# Patient Record
Sex: Female | Born: 1970 | Race: White | Hispanic: No | Marital: Married
Health system: Midwestern US, Community
[De-identification: ages and names within clinical notes are randomized; demographics above are authoritative.]

---

## 2006-09-25 ENCOUNTER — Inpatient Hospital Stay

## 2008-09-08 LAB — COMPREHENSIVE METABOLIC PANEL
ALT: 19 U/L (ref 10–40)
AST: 19 U/L (ref 15–37)
Albumin/Globulin Ratio: 1.6 (ref 1.1–2.2)
Albumin: 4 g/dL (ref 3.4–5.0)
Alkaline Phosphatase: 90 U/L (ref 25–100)
BUN: 24 mg/dL — ABNORMAL HIGH (ref 7–18)
CO2: 23 meq/L (ref 21–32)
Calcium: 9 mg/dL (ref 8.3–10.6)
Chloride: 107 meq/L (ref 99–110)
Creatinine: 0.7 mg/dL (ref 0.6–1.1)
GFR Est, African/Amer: >60
GFR, Estimated: >60 (ref >60)
Glucose: 110 mg/dL — ABNORMAL HIGH (ref 70–99)
Potassium: 4.5 meq/L (ref 3.5–5.1)
Sodium: 140 meq/L (ref 136–145)
Total Bilirubin: 0.5 mg/dL (ref 0.0–1.0)
Total Protein: 6.4 g/dL (ref 6.4–8.2)

## 2008-09-08 LAB — LIPID PANEL
Cholesterol, Total: 176 mg/dl (ref <200)
HDL: 40 mg/dL (ref 40–60)
LDL Calculated: 120 mg/dl — ABNORMAL HIGH (ref <100)
Triglycerides: 84 mg/dl (ref <150)
VLDL Cholesterol Calculated: 17 mg/dl

## 2008-09-15 NOTE — Unmapped (Signed)
Signed by Deeann Dowse MD on 09/15/2008 at 00:00:00  CT Abdomen/Pelvis      Imported By: Betsey Amen 06/22/2009 13:20:08    _____________________________________________________________________    External Attachment:    Please see [facility] EMR for this document.

## 2008-09-16 LAB — CBC WITH AUTO DIFFERENTIAL
Basophils %: 0.5 % (ref 0.0–2.0)
Basophils Absolute: 0 10*3 (ref 0.0–0.2)
Eosinophils %: 2.1 % (ref 0.0–5.0)
Eosinophils Absolute: 0.1 10*3 (ref 0.0–0.6)
Granulocyte Absolute Count: 3.8 10*3 (ref 1.7–7.7)
Hematocrit: 39.5 % (ref 36.0–48.0)
Hemoglobin: 13.3 g/dL (ref 12.0–16.0)
Lymphocytes %: 32.3 % (ref 25.0–40.0)
Lymphocytes Absolute: 2.1 10*3 (ref 1.0–5.1)
MCH: 29.2 pg (ref 26–34)
MCHC: 33.7 g/dL (ref 31–36)
MCV: 86.7 fl (ref 80–100)
MPV: 10.7 fl — ABNORMAL HIGH (ref 5.0–10.5)
Monocytes %: 7.3 % (ref 0.0–8.0)
Monocytes Absolute: 0.5 10*3 (ref 0.0–0.95)
Platelets: 180 10*3 (ref 135–450)
RBC: 4.56 10*6 (ref 4.0–5.2)
RDW: 15.1 % — ABNORMAL HIGH (ref 11.5–14.5)
Segs Relative: 57.8 % (ref 42.0–63.0)
WBC: 6.5 10*3 (ref 4.0–11.0)

## 2008-09-16 LAB — TSH
T4 Free: 0.78 ng/dL — ABNORMAL LOW (ref 0.9–1.8)
TSH: 2.75 u[IU]/mL (ref 0.35–5.5)

## 2008-09-16 LAB — T4, FREE: T4 Free: 0.78 ng/dL — ABNORMAL LOW (ref 0.9–1.8)

## 2008-09-17 LAB — VITAMIN D 25 HYDROXY: Vit D, 25-Hydroxy: 42 ng/mL (ref 30–80)

## 2008-12-25 LAB — LIPID PANEL
Cholesterol, Total: 174 mg/dl (ref <200)
HDL: 41 mg/dL (ref 40–60)
LDL Calculated: 113 mg/dl — ABNORMAL HIGH (ref <100)
Triglycerides: 100 mg/dl (ref <150)
VLDL Cholesterol Calculated: 20 mg/dl

## 2008-12-25 LAB — COMPREHENSIVE METABOLIC PANEL
ALT: 20 U/L (ref 10–40)
AST: 18 U/L (ref 15–37)
Albumin/Globulin Ratio: 1.6 (ref 1.1–2.2)
Albumin: 4.1 g/dL (ref 3.4–5.0)
Alkaline Phosphatase: 116 U/L — ABNORMAL HIGH (ref 25–100)
BUN: 26 mg/dL — ABNORMAL HIGH (ref 7–18)
CO2: 28 meq/L (ref 21–32)
Calcium: 9.5 mg/dL (ref 8.3–10.6)
Chloride: 106 meq/L (ref 99–110)
Creatinine: 1 mg/dL (ref 0.6–1.1)
GFR Est, African/Amer: >60
GFR, Estimated: >60 (ref >60)
Glucose: 103 mg/dL — ABNORMAL HIGH (ref 70–99)
Potassium: 4.1 meq/L (ref 3.5–5.1)
Sodium: 147 meq/L — ABNORMAL HIGH (ref 136–145)
Total Bilirubin: 0.4 mg/dL (ref 0.0–1.0)
Total Protein: 6.6 g/dL (ref 6.4–8.2)

## 2008-12-25 LAB — T4, FREE: T4 Free: 1.04 ng/dL (ref 0.9–1.8)

## 2008-12-25 LAB — TSH
T4 Free: 1.04 ng/dL (ref 0.9–1.8)
TSH: 3.37 u[IU]/mL (ref 0.35–5.5)

## 2008-12-29 LAB — VITAMIN D 25 HYDROXY: Vit D, 25-Hydroxy: 41 ng/mL (ref 30–80)

## 2009-01-07 LAB — COMPREHENSIVE METABOLIC PANEL
ALT: 20 U/L (ref 10–40)
AST: 19 U/L (ref 15–37)
Albumin/Globulin Ratio: 1.6 (ref 1.1–2.2)
Albumin: 4.3 g/dL (ref 3.4–5.0)
Alkaline Phosphatase: 121 U/L
BUN: 21 mg/dL — ABNORMAL HIGH (ref 7–18)
CO2: 26 meq/L (ref 21–32)
Calcium: 9.6 mg/dL (ref 8.3–10.6)
Chloride: 107 meq/L (ref 99–110)
Creatinine: 0.8 mg/dL (ref 0.6–1.1)
GFR Est, African/Amer: >60
GFR, Estimated: >60 (ref >60)
Glucose: 116 mg/dL — ABNORMAL HIGH (ref 70–99)
Potassium: 4.4 meq/L (ref 3.5–5.1)
Sodium: 141 meq/L (ref 136–145)
Total Bilirubin: 0.5 mg/dL (ref 0.0–1.0)
Total Protein: 7 g/dL (ref 6.4–8.2)

## 2009-05-02 LAB — CBC WITH AUTO DIFFERENTIAL
Basophils %: 0.4 % (ref 0.0–2.0)
Basophils Absolute: 0 10*3 (ref 0.0–0.2)
Eosinophils %: 2.1 % (ref 0.0–5.0)
Eosinophils Absolute: 0.1 10*3 (ref 0.0–0.6)
Granulocyte Absolute Count: 4.3 10*3 (ref 1.7–7.7)
Hematocrit: 41.6 % (ref 36.0–48.0)
Hemoglobin: 14 g/dL (ref 12.0–16.0)
Lymphocytes %: 18.5 % — ABNORMAL LOW (ref 25.0–40.0)
Lymphocytes Absolute: 1.1 10*3 (ref 1.0–5.1)
MCH: 28.9 pg (ref 26–34)
MCHC: 33.7 g/dL (ref 31–36)
MCV: 85.8 fl (ref 80–100)
MPV: 9.9 fl (ref 5.0–10.5)
Monocytes %: 7.7 %
Monocytes Absolute: 0.5 10*3
Platelets: 194 10*3 (ref 135–450)
RBC: 4.85 10*6 (ref 4.0–5.2)
RDW: 15.4 % — ABNORMAL HIGH (ref 11.5–14.5)
Segs Relative: 71.3 % — ABNORMAL HIGH (ref 42.0–63.0)
WBC: 6 10*3 (ref 4.0–11.0)

## 2009-05-02 LAB — COMPREHENSIVE METABOLIC PANEL
ALT: 25 U/L (ref 10–40)
AST: 25 U/L (ref 15–37)
Albumin/Globulin Ratio: 1.9 (ref 1.1–2.2)
Albumin: 4.5 g/dL (ref 3.4–5.0)
Alkaline Phosphatase: 117 U/L
BUN: 21 mg/dL — ABNORMAL HIGH (ref 7–18)
CO2: 26 meq/L (ref 21–32)
Calcium: 9.2 mg/dL (ref 8.3–10.6)
Chloride: 107 meq/L (ref 99–110)
Creatinine: 0.8 mg/dL (ref 0.6–1.1)
GFR Est, African/Amer: >60
GFR, Estimated: >60 (ref >60)
Glucose: 120 mg/dL — ABNORMAL HIGH (ref 70–99)
Potassium: 4.2 meq/L (ref 3.5–5.1)
Sodium: 142 meq/L (ref 136–145)
Total Bilirubin: 0.7 mg/dL (ref 0.0–1.0)
Total Protein: 6.9 g/dL (ref 6.4–8.2)

## 2009-05-02 LAB — LIPID PANEL
Cholesterol, Total: 168 mg/dl (ref <200)
HDL: 36 mg/dL — ABNORMAL LOW (ref 40–60)
LDL Calculated: 113 mg/dl — ABNORMAL HIGH (ref <100)
Triglycerides: 97 mg/dl (ref <150)
VLDL Cholesterol Calculated: 19 mg/dl

## 2009-05-02 LAB — TSH
T4 Free: 1.05 ng/dL (ref 0.9–1.8)
TSH: 2.28 u[IU]/mL (ref 0.35–5.5)

## 2009-05-02 LAB — T4, FREE: T4 Free: 1.05 ng/dL (ref 0.9–1.8)

## 2009-05-04 LAB — VITAMIN D 25 HYDROXY: Vit D, 25-Hydroxy: 46 ng/mL (ref 30–80)

## 2009-05-14 ENCOUNTER — Inpatient Hospital Stay: Attending: Sports Medicine

## 2009-06-17 NOTE — Unmapped (Signed)
Signed by Huntley Estelle on 06/17/2009 at 11:56:23    Clinical Lists Changes    Orders:  Added new Test order of CT, Abdomen and Pelvis ([id number]) - Signed

## 2009-06-17 NOTE — Unmapped (Signed)
Signed by Huntley Estelle on 06/17/2009 at 11:39:55    Clinical Lists Changes    Problems:  Added new problem of INCISIONAL HERNIA (ICD-553.21)  Added new problem of ABDOMINAL PAIN (ICD-789.00)  Orders:  Added new Test order of CT, Abdomen and Pelvis w/contrast (CPT-74160/72193) - Signed

## 2009-06-17 NOTE — Unmapped (Signed)
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   Eyes: Denies blurring, diplopia, irritation, discharge, vision loss, eye pain, photophobia, redness.   Ears/Nose/Throat: Denies decreased hearing, dysphagia, ear discharge, earache, facial pain, headaches, hoarseness, mouth ulcers/sores, nasal congestion, nasal ulcers/sores, nosebleeds, post nasal drip, rhinorrhea, sinus pain, sinusitis, sneezing, sore throat, tinnitus.   Cardiovascular: Denies chest pains, dizziness, dyspnea on exertion, dyspnea at rest, palpitations, peripheral edema, PND,  presyncope, orthopnea, syncope.   Respiratory: Denies dyspnea, tachypnea, excessive sputum, hemoptysis, wheezing, productive cough, dry cough, choking, clubbing, sputum, stridor, exercise limitation, chest wall deformity, frequent throat clearing, chest pain, cyanosis, nocturnal cough, exercise-induced cough.   Gastrointestinal: Complains of nausea. Denies vomiting, diarrhea, constipation, change in bowel habits, abdominal pain, melena, hematochezia, jaundice, spitting, encopresis, hematemesis, abdominal distention, edema, ascites.   Genitourinary: Denies vaginal discharge, incontinence, day time wetting, dysuria, hematuria, urinary frequency, amenorrhea, menorrhagia, abnormal vaginal bleeding, pelvic pain, nocturia, flank pain, weak stream, precipitance, incomplete empty, retention, enuresis,  perineal rash, tea-colored urine.   Musculoskeletal: Denies arthritis, extreme deformity, back pain, neck pain, joint pain, joint swelling, joint redness, muscle cramps, muscle weakness, muscle pains, stiffness, warmth, limp.   Skin: Denies hair loss, rash, skin color change, birthmarks, growths, dry skin, moles, warts, bites, pruritus, acne, nail disease, petechia, purpura, suspicious lesions.   Neurologic: Denies weakness.   Psychiatric: Denies behavioral changes, depression, anxiety, memory loss, mental disturbance, suicidal ideation, homicidal ideation, hallucinations, paranoia, learning disability, hyperactivity.   Endocrine: Denies cold intolerance, heat intolerance, polydipsia, polyphagia, polyuria, weight change, growth problems.   Heme/Lymphatic: Denies bone pain, abnormal bruising,  bleeding, enlarged lymph nodes, node tenderness, pallor.   Allergic/Immunologic: Denies hay fever, HIV exposure, itching eyes, nasal congestion, persistent infections, seasonal allergy symptoms, urticaria.       Vital Signs   Height: 69 in.    Weight: 294 lbs.   Weight Change since last visit (lbs): 294Temperature: 98.2 degrees  F     Allergies  ! * PENICILLIN DRUGS  ! CODEINE PHOSPHATE  ! * SULFA  ! * QUINALONES  Allergy and adverse reaction list reviewed during this update.      Medications   LEVOTHROID 75 MCG TABS (LEVOTHYROXINE SODIUM)   JANUMET  TABS (SITAGLIPTIN-METFORMIN HCL TABS)   PROZAC 10 MG CAPS (FLUOXETINE HCL)   NAPROXEN DR 375 MG TBEC (NAPROXEN)   BYETTA 5 MCG PEN  SOLN (EXENATIDE SOLN) once daily    Intake recorded by: Damien Fusi  June 17, 2009 11:14 AM  Smoking Status: non-smoker   Medications reviewed, updated and verified with patient or patient representative.      Physical Exam   General Appearance: well-developed, well-nourished and in no acute distress    Respiratory   Respiratory Effort: breathing comfortably, no increased work of breathing    Cardiovascular   Auscultation: regular rate and  rhythm, S1, S2, no murmur, rub, or  gallop    Gastrointestinal   Abdomen: obese, soft, low transverse incision is well healed.  low midline incisional hernia.     Musculoskeletal   Gait and Station: intact without difficulty    Mental Status   Judgement, insight: awake and alert    Assessment and Plan   38 yo female with recurrent incisional hernia repair.  She had a previous laparascopic repair with mesh.  This is becoming more symptomatic.    will re-evaluate with Ct scan and plan for open repair with mesh.        New Problems:  Dx of ABDOMINAL INCISIONAL HERNIA (ICD-553.21)  Onset: 06/17/2009    Today's Orders   TOBACCO USE ASSESSED [CPT-1000F]  CURRENT TOBACCO NON-USER  [CPT-1036F]  PT ENCOUNTER WAS DOCUMENTED USING [facility] CERTIFIED EMR [CPT-G8447]  LIST CURRENT MEDICATIONS WITH DOSAGES AND VERIFICATION DOCUMENTED [[id number]]  29562 - Ofc Consult, Level III [[id number]]    DISPOSITION:    Return to clinic after testing        Medical Decision-Making   Visit was primarily counseling/coordination. Time spent: 20 minutes

## 2009-06-19 ENCOUNTER — Inpatient Hospital Stay

## 2009-06-19 NOTE — Unmapped (Signed)
Signed by   [employer] on 06/19/2009 at 10:59:39  Patient: Tamara Cox  Note: All result statuses are Final unless otherwise noted.    Tests: (1) CT-PELVIS W/O CONT ([id number])    Order Note: [facility] Accession Number: [id number]    Order Note:                                 [name]     Patient: Tamara Cox, Tamara Cox   DOB:     1970/07/19   MRN:     [id number]   FIN:       [id number]   Accn#:   [id number]                                   Computed Tomography     Exam                       Exam Date/Time       Ordering Physician   CT-PELVIS W/O CONT         06/19/2009 09:22 EST Ramata Strothman L   CT-ABDOMEN W/O CONT        06/19/2009 09:22 EST Silveria Botz L     Reason for Exam   (CT-PELVIS W/O CONT)  incisional hernia; abdominal pain   (CT-ABDOMEN W/O CONT)  incisional hernia; abdominal pain     Report   CT of the abdomen and pelvis without IV contrast dated 06/19/2009     Clinical Indication: Incisional hernia. Abdominal pain.     Comparison: 11/16/2002 formed at the [facility].     Technical factors: Multidetector helical scan is performed from the dome of   the diaphragm to the pubic symphysis after administration of oral contrast   agents. IV contrast is not administered per referring physician's order.   Axial images are displayed in 5 mm collimation with a field-of-view of 36.0   cm. The coronal 5 mm images are generated by computer reformation.     Findings:     Lung bases are clear. Noncontrast images of the liver, spleen, pancreas and   gallbladder are normal. No adrenal mass is seen. Bilateral kidneys and   ureters appear normal. The bladder is unremarkable.     The uterus has been removed in the interval. There is interval appearance   of a defect in the lower anterior abdominal wall at midline location   between the right and left rectus muscle. The defect measures 2.5 x 3.5 cm   in transverse and vertical dimensions.  There is herniation of mesenteric   fat without involvement of bowel through  the defect. The hernia sac   measures 3.4 x 7.4 x 6.0 cm in AP, transverse, and vertical dimension.   There is evidence of previous ventral hernia repair, evidenced by a mesh   that has become detached and displaced anteriorly into the hernia sac,   along with multiple anchors used for attachment of the mesh.     The stomach and small bowel appear normal. The evaluation of the colon is   limited by the absence of luminal contrast opacification. There is no   lymphadenopathy or ascites.     Impression:     CT abdomen:     No acute abnormality.     CT pelvis:     1. Interval performance  of hysterectomy and ventral hernia repair.   2. Recurrent ventral hernia in the lower anterior midline abdominal wall as   described.   ********** VERIFIED REPORT **********     Dictated: 06/19/2009 10:37 am     LEE M.D., Garret Reddish   Signed (Electronic Signature):  LEE M.D., SU-JU                 06/19/09   10:59     Technologist: JSR    Order Note:   EMR Routing to: Laurence Aly - ordering - [id number]    Note: An exclamation mark (!) indicates a result that was not dispersed into   the flowsheet.  Document Creation Date: 06/19/2009 10:59 AM  _______________________________________________________________________    (1) Order result status: Final  Collection or observation date-time: 06/19/2009 09:22:08  Requested date-time: 06/19/2009 08:42:00  Receipt date-time:   Reported date-time: 06/19/2009 10:59:21  Referring Physician: Deeann Dowse  Ordering Physician: Deeann Dowse (HUSTET)  Specimen Source: RAD&Rad Type  Source: QRS  Filler Order Number: [id number]  Lab site: [facility]

## 2009-06-19 NOTE — Unmapped (Signed)
Signed by   [employer] on 06/19/2009 at 10:59:40  Patient: Tamara Cox  Note: All result statuses are Final unless otherwise noted.    Tests: (1) CT-ABDOMEN W/O CONT ([id number])    Order Note: [facility] Accession Number: [id number]    Order Note:                                 [name]     Patient: [name], [name]   DOB:     1970/10/30   MRN:     [id number]   FIN:       [id number]   Accn#:   [id number]                                   Computed Tomography     Exam                       Exam Date/Time       Ordering Physician   CT-PELVIS W/O CONT         06/19/2009 09:22 EST Traxton Kolenda L   CT-ABDOMEN W/O CONT        06/19/2009 09:22 EST Rainee Sweatt L     Reason for Exam   (CT-PELVIS W/O CONT)  incisional hernia; abdominal pain   (CT-ABDOMEN W/O CONT)  incisional hernia; abdominal pain     Report   CT of the abdomen and pelvis without IV contrast dated 06/19/2009     Clinical Indication: Incisional hernia. Abdominal pain.     Comparison: 11/16/2002 formed at the [facility].     Technical factors: Multidetector helical scan is performed from the dome of   the diaphragm to the pubic symphysis after administration of oral contrast   agents. IV contrast is not administered per referring physician's order.   Axial images are displayed in 5 mm collimation with a field-of-view of 36.0   cm. The coronal 5 mm images are generated by computer reformation.     Findings:     Lung bases are clear. Noncontrast images of the liver, spleen, pancreas and   gallbladder are normal. No adrenal mass is seen. Bilateral kidneys and   ureters appear normal. The bladder is unremarkable.     The uterus has been removed in the interval. There is interval appearance   of a defect in the lower anterior abdominal wall at midline location   between the right and left rectus muscle. The defect measures 2.5 x 3.5 cm   in transverse and vertical dimensions.  There is herniation of mesenteric   fat without involvement of bowel  through the defect. The hernia sac   measures 3.4 x 7.4 x 6.0 cm in AP, transverse, and vertical dimension.   There is evidence of previous ventral hernia repair, evidenced by a mesh   that has become detached and displaced anteriorly into the hernia sac,   along with multiple anchors used for attachment of the mesh.     The stomach and small bowel appear normal. The evaluation of the colon is   limited by the absence of luminal contrast opacification. There is no   lymphadenopathy or ascites.     Impression:     CT abdomen:     No acute abnormality.     CT pelvis:     1. Interval performance  of hysterectomy and ventral hernia repair.   2. Recurrent ventral hernia in the lower anterior midline abdominal wall as   described.   ********** VERIFIED REPORT **********     Dictated: 06/19/2009 10:37 am     LEE M.D., Garret Reddish   Signed (Electronic Signature):  LEE M.D., SU-JU                 06/19/09   10:59     Technologist: JSR    Order Note:   EMR Routing to: Laurence Aly - ordering - [id number]    Note: An exclamation mark (!) indicates a result that was not dispersed into   the flowsheet.  Document Creation Date: 06/19/2009 10:59 AM  _______________________________________________________________________    (1) Order result status: Final  Collection or observation date-time: 06/19/2009 09:22:08  Requested date-time: 06/19/2009 08:42:00  Receipt date-time:   Reported date-time: 06/19/2009 10:59:21  Referring Physician: Deeann Dowse  Ordering Physician: Deeann Dowse (HUSTET)  Specimen Source: RAD&Rad Type  Source: QRS  Filler Order Number: [id number]  Lab site: [facility]

## 2009-06-25 NOTE — Unmapped (Signed)
Signed by Huntley Estelle on 06/30/2009 at 08:55:56    UCP Surgery Scheduling Form    Surgery  / Procedure Schedule Sheet   Requested Date: 07/16/2009  Requested Time: 2:30 PM  Length of Surgery: 1.5 HOURS  Form Completed By: Huntley Estelle  Surgeon: Deeann Dowse MD  Facility: [facility]  Type of patient: Private  Will patient go to: PACU  Is patient: Out Pt.  Anesthesia Type: General  Post-Op Epidural: No    Allergies:   ! * PENICILLIN DRUGS  ! CODEINE PHOSPHATE  ! * SULFA  ! * QUINALONES      Latex Sensitive: No  Patient will not need PAT appointment    H&P Appointment at: [facility]   Date: 06/25/2009    Procedure   Procedure: RECURRENT INCISIONAL HERNIA REPAIR WITH MESH  Diagnoses: INCISIONAL HERNIA (ICD-553.21)      Patient Information   Name: Tamara Cox  DOB: Sep 14, 1970  SSN: [id number]  Address: [address]  [place], [place]  [address]  Gender: Female  Home phone: [phone]  IDX #: [id number]  Last Word #: [id number]  Primary Insurance: [employer] Mallow OFFICE 2  Member ID #: [id number]  Secondary Insurance: SELF PAY PRIMARY    Surgery Notification   Scheduled Date: 07/16/2009  Scheduled Time: 2:30 pm  Surgery Scheduled with: Deeann Dowse, MD  Confirmation #: [id number]  Date: 06/30/2009  Surgery Confirmed with: Drinda Butts  Date: 06/30/2009

## 2009-06-25 NOTE — Unmapped (Signed)
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   Cardiovascular: Denies chest pains, dizziness, dyspnea on exertion, dyspnea at rest, palpitations, peripheral edema, PND,  presyncope, orthopnea, syncope.   Respiratory: Denies dyspnea, tachypnea, excessive sputum, hemoptysis, wheezing, productive cough, dry cough, choking, clubbing, sputum, stridor, exercise limitation, chest wall deformity, frequent throat clearing, chest pain, cyanosis, nocturnal cough, exercise-induced cough.       Vital Signs   Height: 69 in.    Weight: 299.8 lbs.   Weight Change since last visit (lbs): 5.80  BMI (in-lb): 44.43   BSA (m2): 2.46  Temperature: 98.4 degrees  F     Allergies  ! * PENICILLIN DRUGS  ! CODEINE PHOSPHATE  ! * SULFA  ! * QUINALONES  Allergy and adverse reaction list reviewed during this update.      Medications   LEVOTHROID 75 MCG TABS (LEVOTHYROXINE SODIUM)   JANUMET  TABS (SITAGLIPTIN-METFORMIN HCL TABS)   PROZAC 10 MG CAPS (FLUOXETINE HCL)   NAPROXEN DR 375 MG TBEC (NAPROXEN)   BYETTA 5 MCG PEN  SOLN (EXENATIDE SOLN) once daily    Intake recorded by: Damien Fusi  June 25, 2009 11:13 AM      Physical Exam   General Appearance: well-developed, well-nourished and in no acute distress    Respiratory   Respiratory Effort: breathing comfortably, no increased work of breathing  Percussion: symmetric, no dullness or hyperresonance  Palpation: symmetric  tactile fremitus  Auscultation: CTA bilaterally with symmetric breath sounds, good air movement, no crackles bronchi or wheezing noted    Cardiovascular   Palpation: no thrill, no displacement of PMI  Auscultation: regular rate and rhythm, S1, S2, no murmur, rub, or gallop  Carotid arteries: pulses 2+, symmetric, no bruits  Abdominal aorta: no enlargement or bruits, no tenderness  Femoral arteries: pulses 2+, symmetric, no bruits  Pedal pulse: DP/PT pulses 2+, symmetric, radial pulse 2+ symmetric, brachial pulse 2+ symmetric  Peripheral Circulation: no cyanosis, clubbing or edema, capillary refill brisk, well perfused, no ulceration    Gastrointestinal   Abdomen: soft, non-tender, non-distended, no masses,  bowel sounds present, no rebound tenderness, no guarding, no organomegaly  Liver and Spleen: no enlargement or nodularity  Hernia: recurrent incisional hernia at pfannensteil incision    Assessment and Plan   38 y/o woman with recurrent lower abdominal wall incisional hernia, will plan open repair with mesh in 2 weeks. Patient was informed of risks, benefits and alternatives for the procedure and patient wishes to proceed.  Consent was discussed and signed. Patient was given ample opportunity to have all questions answered.         Today's Orders   53664 - Ofc Vst, New Level III [CPT-99203]    cc:   [name], MD    Medical Decision-Making     Amount/complexity of data to be reviewed   * review/order radiology tests  Risk of complications: moderate

## 2009-06-26 NOTE — Unmapped (Signed)
Signed by Dionne Ano on 06/26/2009 at 11:20:59                   [facility]         General Surgery         [address]         [place], [place] [address]         p [phone] f [phone]         [email or web address]            June 25, 2009              RE: Tamara Cox  DOB:   [birth year]      Dear Eugenie Birks, MD,        I had the pleasure of seeing your patient, Tamara Cox in the office today for follow-up care. Enclosed is a copy of my office note and recommendations regarding this patient.    Thank you for the opportunity to participate in Tamara Cox's care and please do not hesitate to contact me with any questions.    Sincerely,        Deeann Dowse, MD          CC

## 2009-06-30 NOTE — Unmapped (Signed)
Signed by Huntley Estelle on 06/30/2009 at [facility]        [facility] Physician's Checklist/Order Sheet:   Name: Tamara Cox #: [id number]  Age: 38 Years Old Old  DOB: [birth year]  Gender: Female  Home phone: [phone]  Surgeon/Team: Deeann Dowse, MD  Surgery Date: 07/16/2009  Type of Surgery: RECURRENT INCISIONAL HERNIA REPAIR WITH MESH    Admit Status: SDS  Intended Postoperative Disposition: Discharge home  PCP: [name], MD  Needs Language Interpreter: No    Level of CPC Visit:   A.  Phone Screen (no CPC visit) - H&P done by: Surgeon/Designee    Allergies:   ! * PENICILLIN DRUGS  ! CODEINE PHOSPHATE  ! * SULFA  ! * QUINALONES  Allergy and adverse reaction list reviewed during this update.    Latex Allergy: No    Special Prep:   Phisohex scrub     Height: 69 inches.  Weight: 299.8 pounds.    Alternative Physician Order:   Antibiotic: Vancomycin 1 Gm IV x 1    Physician: Deeann Dowse, MD

## 2009-07-16 ENCOUNTER — Inpatient Hospital Stay

## 2009-07-16 NOTE — Unmapped (Signed)
Signed by   [employer] on 07/16/2009 at 19:56:59  Patient: Tamara Cox  Note: All result statuses are Final unless otherwise noted.    Tests: (1)  (MR)    Order Note:                                         [facility]     PATIENT NAME:   Tamara Cox, Tamara Cox                  MR #:  [id number]  DATE OF BIRTH:  [birth year]                        ACCOUNT #:  [id number]  SURGEON:        Deeann Dowse, M.D.               ROOM #:  SDS  SERVICE:        General Surgery                   NURSING UNIT:  MSDS  PRIMARY:        Knute Neu. Duanne Limerick, M.D.            FCSalena Saner  REFERRING:      Deeann Dowse, M.D.               ADMIT DATE:  07/16/2009  DICTATED BY:    Salome Spotted, M.D.            SURGERY DATE:  07/16/2009                                                    DISCHARGE DATE:                                    OPERATIVE REPORT     *-*-*     PREOPERATIVE DIAGNOSES:     1.  Incisional hernia.     POSTOPERATIVE DIAGNOSIS:     1.  Incisional hernia.     PROCEDURE PERFORMED:     1.  Incisional hernia repair with mesh.     SURGEON:  Deeann Dowse M.D.     ASSISTANT:   Salome Spotted M.D.     ANESTHESIA:  General endotracheal.     ESTIMATED BLOOD LOSS:  Minimal.     TIME OF OPERATION:  One and a half hours.     DRAINS:  None.     SPECIMENS REMOVED:  None.     FINDINGS:  Approximately 8 cm x 8 cm hernia defect through the fascia from  the prior Pfannenstiel incision.  A previously placed mesh was also found  within the hernia sac.  No bowel was contained in the hernia, only  preperitoneal fat.     COMPLICATIONS:  None apparent.     INDICATIONS FOR PROCEDURE:  The patient is a 38 year old woman who previously  underwent a gynecologic surgery through a Pfannenstiel incision.  She is  status post a hernia developing in this area and status post a laparoscopic  attempted repair of this hernia.  She then recurred this hernia, again  in the  fascia underlying her Pfannenstiel incision.  She was referred to Dr. Lynetta Mare  for this  problem.  The risks and benefits of open repair of this hernia were  explained to her and she elected to undergo the operation.     DETAILS OF PROCEDURE:  On the day of surgery, the patient initially presented  to the preoperative area.  While there, consent was reviewed and found to be  appropriate.  She was seen by anesthesia and intravenous access was obtained.  From there, she was transferred to the operating room and placed supine on  the operating table.  Her arms were placed out on arm boards bilaterally.  All bony prominences were appropriately padded.  At this point, general  anesthesia was induced and she was endotracheally intubated without  difficulty.  A Foley catheter was placed under sterile conditions, and left  to gravity for the remainder of the case.  Her lower abdomen was then clipped  and then prepped and draped in sterile fashion.  A timeout procedure was then  performed to identify the patient, the attending surgeon, the procedure being  performed, allergies, antibiotic, and that all appropriate equipment was in  the room.  She received a single dose of intravenous vancomycin.     At this point, her previous Pfannenstiel incision was opened with the use of  a 10 blade scalpel.  Dissection was carried down through the subcutaneous  tissues with use of electrocautery until the inferior fascial edge was  identified by palpation.  Using electrocautery, the anterior surface of the  fascia in this direction was then cleared.  The hernia sac was noted,  adherent to the undersurface of the fascia in this location.  At this point,  further dissection was carried down through the subcutaneous tissue until the  superior edge of the fascia could be identified through the incision.  Likewise, the hernia could be identified inferior to this, adherent to the  undersurface of the fascia in this location as well.  Dissection was  continued with electrocautery, gently clearing the hernia sac from  the  undersurface of the fascia in all directions.  At one point, the hernia sac  was entered and a large amount of omentum was noted to be protruding through  it.  There was no bowel noted.  Additionally, the previously placed mesh was  also found to be adherent to this fat protruding into the hernia sac.  All of  this was reduced back into the abdomen, as it was cleared from the  undersurface of the remaining fascia.  Portions of the hernia sac were  divided during this dissection, to free up the anterior surface of the fascia.     Once this had been performed, the fascia was identified in all directions,  and cleared off on both the anterior and posterior surfaces and  circumferentially.  A Ventralight mesh was then introduced into the field and  cut to measure approximately 10 x 10 cm.  This represented at least 2 cm  overlap in every direction from the measured fascial defect.  This was then  placed in an underlay fashion with the use of interrupted 0 Vicryl sutures.  The fascia was then closed over the mesh with interrupted figure-of-eight 0  Prolene sutures.  Care was taken during this time not to have the needle  enter the peritoneal cavity.  The wound was irrigated with warm saline and  found to be hemostatic.  The subcutaneous  tissue, specifically Scarpa's  fascia, was closed with the use of interrupted 3-0 Vicryl sutures.  The skin  was then closed with staples.     At the end of the case, all sponge, needle, and instrument counts were  correct.  Following the procedure, the patient was extubated and transported  to the recovery area in stable condition.  Dr. Lynetta Mare was scrubbed for the  entirety of the case and directed all surgical decision-making.        *-*-*                                              _______________________________________  RM/kbc                                 _____  D:  07/16/2009 16:11                  Deeann Dowse, M.D.  T:  07/16/2009 19:46                  Dictated by:   Salome Spotted, M.D.  Job #:  [id number]                                           OPERATIVE REPORT                                                               PAGE    1 of   1    Note: An exclamation mark (!) indicates a result that was not dispersed into   the flowsheet.  Document Creation Date: 07/16/2009 7:56 PM  _______________________________________________________________________    (1) Order result status: Final  Collection or observation date-time: 07/16/2009 00:00  Requested date-time:   Receipt date-time:   Reported date-time:   Referring Physician: Deeann Dowse  Ordering Physician:  Reviewed In [facility])  Specimen Source:   Source: DBS  Filler Order Number: [id number]  Lab site:

## 2009-07-16 NOTE — Unmapped (Signed)
[facility]     PATIENT NAME:   Tamara Cox, Tamara Cox                  MR #:  [id number]   DATE OF BIRTH:  [birth year]                        ACCOUNT #:  [id number]   SURGEON:        Deeann Dowse, M.D.               ROOM #:  SDS   SERVICE:        General Surgery                   NURSING UNIT:  MS[facility]   PRIMARY:        Knute Neu. Duanne Limerick, M.D.            FCSalena Saner   REFERRING:      Deeann Dowse, M.D.               ADMIT DATE:  07/16/2009   DICTATED BY:    Salome Spotted, M.D.            SURGERY DATE:  07/16/2009                                                     DISCHARGE DATE:                                    OPERATIVE REPORT     *-*-*     PREOPERATIVE DIAGNOSES:     1.  Incisional hernia.     POSTOPERATIVE DIAGNOSIS:     1.  Incisional hernia.     PROCEDURE PERFORMED:     1.  Incisional hernia repair with mesh.     SURGEON:  Deeann Dowse M.D.     ASSISTANT:   Salome Spotted M.D.     ANESTHESIA:  General endotracheal.     ESTIMATED BLOOD LOSS:  Minimal.     TIME OF OPERATION:  One and a half hours.     DRAINS:  None.     SPECIMENS REMOVED:  None.     FINDINGS:  Approximately 8 cm x 8 cm hernia defect through the fascia from   the prior Pfannenstiel incision.  A previously placed mesh was also found   within the hernia sac.  No bowel was contained in the hernia, only   preperitoneal fat.     COMPLICATIONS:  None apparent.     INDICATIONS FOR PROCEDURE:  The patient is a 38 year old woman who previously   underwent a gynecologic surgery through a Pfannenstiel incision.  She is   status post a hernia developing in this area and status post a laparoscopic   attempted repair of this hernia.  She then recurred this hernia, again in the   fascia underlying her Pfannenstiel incision.  She was referred to Dr. Lynetta Mare   for this problem.  The risks and benefits of open repair of this hernia were   explained to her and she elected to undergo the operation.     DETAILS OF PROCEDURE:  On the day  of surgery, the patient initially  presented   to the preoperative area.  While there, consent was reviewed and found to be   appropriate.  She was seen by anesthesia and intravenous access was obtained.   From there, she was transferred to the operating room and placed supine on   the operating table.  Her arms were placed out on arm boards bilaterally.   All bony prominences were appropriately padded.  At this point, general   anesthesia was induced and she was endotracheally intubated without   difficulty.  A Foley catheter was placed under sterile conditions, and left   to gravity for the remainder of the case.  Her lower abdomen was then clipped   and then prepped and draped in sterile fashion.  A timeout procedure was then   performed to identify the patient, the attending surgeon, the procedure being   performed, allergies, antibiotic, and that all appropriate equipment was in   the room.  She received a single dose of intravenous vancomycin.     At this point, her previous Pfannenstiel incision was opened with the use of   a 10 blade scalpel.  Dissection was carried down through the subcutaneous   tissues with use of electrocautery until the inferior fascial edge was   identified by palpation.  Using electrocautery, the anterior surface of the   fascia in this direction was then cleared.  The hernia sac was noted,   adherent to the undersurface of the fascia in this location.  At this point,   further dissection was carried down through the subcutaneous tissue until the   superior edge of the fascia could be identified through the incision.   Likewise, the hernia could be identified inferior to this, adherent to the   undersurface of the fascia in this location as well.  Dissection was   continued with electrocautery, gently clearing the hernia sac from the   undersurface of the fascia in all directions.  At one point, the hernia sac   was entered and a large amount of omentum was noted to be protruding  through   it.  There was no bowel noted.  Additionally, the previously placed mesh was   also found to be adherent to this fat protruding into the hernia sac.  All of   this was reduced back into the abdomen, as it was cleared from the   undersurface of the remaining fascia.  Portions of the hernia sac were   divided during this dissection, to free up the anterior surface of the   fascia.     Once this had been performed, the fascia was identified in all directions,   and cleared off on both the anterior and posterior surfaces and   circumferentially.  A Ventralight mesh was then introduced into the field and   cut to measure approximately 10 x 10 cm.  This represented at least 2 cm   overlap in every direction from the measured fascial defect.  This was then   placed in an underlay fashion with the use of interrupted 0 Vicryl sutures.   The fascia was then closed over the mesh with interrupted figure-of-eight 0   Prolene sutures.  Care was taken during this time not to have the needle   enter the peritoneal cavity.  The wound was irrigated with warm saline and   found to be hemostatic.  The subcutaneous tissue, specifically Scarpa's   fascia, was closed with the use of interrupted 3-0 Vicryl sutures.  The skin  was then closed with staples.     At the end of the case, all sponge, needle, and instrument counts were   correct.  Following the procedure, the patient was extubated and transported   to the recovery area in stable condition.  Dr. Lynetta Mare was scrubbed for the   entirety of the case and directed all surgical decision-making.       *-*-*                                             _______________________________________   RM/kbc                                 _____   D:  07/16/2009 16:11                  Deeann Dowse, M.D.   T:  07/16/2009 19:46                  Dictated by:  Salome Spotted, M.D.   Job #:  [id number]                                         OPERATIVE REPORT                                                                 PAGE    1 of   1   D:  07/16/2009 16:11                  Deeann Dowse, M.D.   T:  07/16/2009 19:46                  Dictated by:  Salome Spotted, M.D.   Job #:  [id number]                                         OPERATIVE REPORT                                                                PAGE    1 of   1

## 2009-07-23 NOTE — Unmapped (Signed)
Signed by Deeann Dowse MD on 07/23/2009 at 09:46:29    General Surgery Postoperative Visit    POST-OP VISIT   Date of Surgery: 07/16/2009     Procedure:  Incisional hernia repair with mesh    Complaints: Pain, fever, fatigue.       Details:  drainage  PAST HISTORY  Surgical History (reviewed - no changes required):  Hernia Surgery: ventral 05/2007/  Incisional hernia repair with mesh. 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    Weight: 298.5 lbs.   Weight Change since last visit (lbs): -299.80Temperature: 99.4 degrees  F     Allergies  ! * PENICILLIN DRUGS  ! CODEINE PHOSPHATE  ! * SULFA  ! * QUINALONES  Allergy and adverse reaction list reviewed during this update.      Medications   LEVOTHROID 75 MCG TABS (LEVOTHYROXINE SODIUM)   JANUMET  TABS (SITAGLIPTIN-METFORMIN HCL TABS)   PROZAC 10 MG CAPS (FLUOXETINE HCL)   BYETTA 5 MCG PEN  SOLN (EXENATIDE SOLN) once daily  PROMETHAZINE HCL 25 MG TABS (PROMETHAZINE HCL) as needed nausea  PERCOCET  TABS (OXYCODONE-ACETAMINOPHEN TABS)     Intake recorded by: Damien Fusi  July 23, 2009 9:16 AM  Smoking Status: non-smoker   Medications reviewed, updated and verified with patient or patient representative.      Physical Exam   General Appearance: well-developed, well-nourished and in no acute distress    Respiratory   Respiratory Effort: breathing comfortably, no increased work of breathing  Percussion: symmetric, no dullness or hyperresonance  Palpation: symmetric tactile fremitus  Auscultation: CTA bilaterally with symmetric breath sounds, good air movement, no crackles bronchi or wheezing noted    Cardiovascular   Palpation: no thrill, no displacement of PMI  Auscultation: regular rate and rhythm, S1, S2, no murmur, rub, or  gallop  Carotid arteries: pulses 2+, symmetric, no bruits  Abdominal aorta: no enlargement or bruits, no tenderness  Femoral arteries: pulses 2+, symmetric, no bruits  Pedal pulse: DP/PT pulses 2+, symmetric, radial pulse 2+ symmetric, brachial pulse 2+ symmetric  Peripheral Circulation: no cyanosis, clubbing or edema, capillary refill brisk, well perfused, no ulceration    Gastrointestinal   Abdomen: soft, non-tender, non-distended, no masses,  bowel sounds present, no rebound tenderness, no guarding, no organomegaly  Liver and Spleen: no enlargement or nodularity  Hernia: no hernias    Assessment and Plan   39 y/o woman who is post operative from an incisional hernia repair, doing well at this time, the incisions are well healed and pain is controlled. The patient has returned to a regular diet and continues to avoid lifting over 8 pounds for a total of six weeks after the operation. she will return in 1 week.       Today's Orders   TOBACCO USE ASSESSED [CPT-1000F]  CURRENT TOBACCO NON-USER  [CPT-1036F]  PT ENCOUNTER WAS DOCUMENTED USING [facility] CERTIFIED EMR [CPT-G8447]  LIST CURRENT MEDICATIONS WITH DOSAGES AND VERIFICATION DOCUMENTED [LKG-M0102]  72536 - Post-op Follow-up Visit [CPT-99024]    cc:   [name], MD    Medical Decision-Making     Risk of complications: moderate

## 2009-07-24 NOTE — Unmapped (Signed)
Signed by Dionne Ano on 07/24/2009 at [facility]                   [facility]         General Surgery         [address]         [place], [place] [address]         p [phone] f [phone]         [email or web address]            July 23, 2009              RE: Tamara Cox  DOB:   1970-09-11      Dear [name], MD ,    I had the pleasure of seeing your patient, Tamara Cox in the office today for follow-up care. Enclosed is a copy of my office note and recommendations regarding this patient.    Thank you for the opportunity to participate in [name]'s care and please do not hesitate to contact me with any questions.    Sincerely,        Deeann Dowse, MD          CC

## 2009-07-26 NOTE — Unmapped (Signed)
[facility]     PATIENT NAME:   Tamara Cox, Tamara Cox                  MR #:  [id number]   DATE OF BIRTH:  [birth year]                        ACCOUNT #:  [id number]   ED PHYSICIAN:   Janne Napoleon, M.D.        ROOM #:   PRIMARY:        Knute Neu. Duanne Limerick, M.D.            NURSING UNIT:  ED   REFERRING:                                        FC:  C   DICTATED BY:    Janne Napoleon, M.D.        ADMIT DATE:  07/26/2009   VISIT DATE:     07/26/2009                        DISCHARGE DATE:                               EMERGENCY DEPARTMENT NOTE     *-*-*     CHIEF COMPLAINT:  Postop bleeding.     HISTORY OF PRESENT ILLNESS:  This is a 39 year old female who had a hernia   repair done on 12/30.  She states that she had really no complications to it.   She has an appointment with Dr. Lynetta Mare on Thursday.  She states that on   January 2nd or 3rd she had a low-grade fever that never got higher than   100.8.  She called the office and was reassured.  On Wednesday she had a   little bit of watery bleeding from her left side.  She denies any heavy   lifting.  She called the office and was seen on Thursday, but by the time Dr.   Lynetta Mare saw her it had essentially stopped.  She states that Friday night   after she took a shower it was oozing for about an hour and then it stopped.   This morning she was sitting and as she went to stand to go to the bathroom   it started coming out as she pulled down her pants.  She states that she   caught it with a Dixie cup and now it is darker.  She states it does not   really hurt any worse.  She denies any heavy lifting.  No fevers.     PAST MEDICAL HISTORY:     1. Depression.   2. Anxiety.   3. Diabetes.   4. Hypothyroidism.   5. Postop infection.   6. Hysterectomy.   7. Hernia repair.     ALLERGIES:     1. Penicillin.   2. Sulfa.   3. Codeine.   4. Quinolones.     SOCIAL HISTORY:  Negative tobacco, alcohol or drugs.     MEDICATIONS:     1.  Levothyroxine.   2. Janumet 50/1000.   3. Fluoxetine 10.   4. Oxycodone.   5. Phenergan.     REVIEW OF SYSTEMS:  Negative for fever, chest pain,  increase in her abdominal   pain, redness or drainage other than the dark red blood from her incision   site.  Other review of systems are negative.     PHYSICAL EXAMINATION:     GENERAL:  Well-developed, well-nourished white female in nonacute distress.   VITAL SIGNS:  Temperature 97.9, respirations 18, pulse 86, blood pressure   149/95, pulse ox on room air is 96%.   HEENT:  Atraumatic.   CARDIOVASCULAR:  Regular rate and rhythm.   LUNGS:  Clear to auscultation.   ABDOMEN:  Soft.  Her Pfannenstiel incision site is actually almost completely   healed on the right side.  On the left side, the staples are still in place.   There is no erythema, warmth or swelling.  There is no discharge, however,   with expression a little trickle of dark red serosanguineous fluid comes out   from the left side of her incision site.  There are no signs of infection.   It does slow down after you milk it for a little bit.   SKIN:  Warm and dry.   NEUROLOGIC:  Awake, alert.     EMERGENCY DEPARTMENT COURSE:  I spoke with Dr. Lynetta Mare.  At this point, it   looks like she is liquefying a seroma.  Certainly there are no signs of   active bleeding.     IMPRESSION:     1.  Postoperative seroma liquefaction.     PLAN:     1.  She is to follow up with Dr. Lynetta Mare next week as scheduled.   3.  She is to return for bright red bleeding or problems.     DISPOSITION:  She is discharged to home in good condition.       *-*-*                                             _______________________________________   EL/krm                                 _____   D:  07/26/2009 13:18                  Janne Napoleon, M.D.   T:  07/26/2009 16:46   Job #:  [id number]                                    EMERGENCY DEPARTMENT NOTE                                                                PAGE    1 of   1   EL/krm                                  _____   D:  07/26/2009 13:18                  Janne Napoleon, M.D.  T:  07/26/2009 16:46   Job #:  [id number]                                    EMERGENCY DEPARTMENT NOTE                                                                PAGE    1 of   1

## 2009-07-30 NOTE — Unmapped (Signed)
Signed by Deeann Dowse MD on 07/30/2009 at 16:53:26    General Surgery Postoperative Visit    POST-OP VISIT   Date of Surgery: 07/16/2009     Procedure:  Incisional hernia repair with mesh    Complaints: None.    PAST HISTORY  Surgical History (reviewed - no changes required):  Hernia Surgery: ventral 05/2007/  Incisional hernia repair with mesh. 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    Weight: 298 lbs.   Weight Change since last visit (lbs): -298.50Temperature: 98.2 degrees  F     Allergies  ! * PENICILLIN DRUGS  ! CODEINE PHOSPHATE  ! * SULFA  ! * QUINALONES  Allergy and adverse reaction list reviewed during this update.      Medications   LEVOTHROID 75 MCG TABS (LEVOTHYROXINE SODIUM)   JANUMET  TABS (SITAGLIPTIN-METFORMIN HCL TABS)   PROZAC 10 MG CAPS (FLUOXETINE HCL)   BYETTA 5 MCG PEN  SOLN (EXENATIDE SOLN) once daily  PROMETHAZINE HCL 25 MG TABS (PROMETHAZINE HCL) as needed nausea  PERCOCET  TABS (OXYCODONE-ACETAMINOPHEN TABS)     Intake recorded by: Damien Fusi  July 30, 2009 11:02 AM  Smoking Status: non-smoker   Medications reviewed, updated and verified with patient or patient representative.      Physical Exam   General Appearance: well-developed, well-nourished and in no acute distress    Respiratory   Respiratory Effort: breathing comfortably, no increased work of breathing  Percussion: symmetric, no dullness or hyperresonance  Palpation: symmetric tactile fremitus  Auscultation: CTA bilaterally with symmetric breath sounds, good air movement, no crackles bronchi or wheezing noted    Cardiovascular   Palpation: no thrill, no displacement of PMI  Auscultation: regular rate and rhythm, S1, S2, no murmur, rub, or gallop  Carotid arteries: pulses 2+, symmetric, no  bruits  Abdominal aorta: no enlargement or bruits, no tenderness  Femoral arteries: pulses 2+, symmetric, no bruits  Pedal pulse: DP/PT pulses 2+, symmetric, radial pulse 2+ symmetric, brachial pulse 2+ symmetric  Peripheral Circulation: no cyanosis, clubbing or edema, capillary refill brisk, well perfused, no ulceration    Gastrointestinal   Abdomen: soft, non-tender, non-distended, no masses,  bowel sounds present, no rebound tenderness, no guarding, no organomegaly  Liver and Spleen: no enlargement or nodularity  Hernia: no hernias    Assessment and Plan   39 y/o woman who is post operative from an incisional hernia repair, doing well at this time, the incisions are well healed and pain is controlled. The patient has returned to a regular diet and continues to avoid lifting over 8 pounds for a total of six weeks after the operation. No need for further return to the office unless problems arise; the patient expresses understanding and agrees to this plan.       Today's Orders   TOBACCO USE ASSESSED [CPT-1000F]  CURRENT TOBACCO NON-USER  [CPT-1036F]  PT ENCOUNTER WAS DOCUMENTED USING [facility] CERTIFIED EMR [CPT-G8447]  LIST CURRENT MEDICATIONS WITH DOSAGES AND VERIFICATION DOCUMENTED [[id number]]  13244 - Post-op Follow-up Visit [CPT-99024]    cc:   [name], MD    Medical Decision-Making     Risk of complications: moderate

## 2009-07-31 NOTE — Unmapped (Signed)
Signed by Dionne Ano on 07/31/2009 at [facility]                   [facility]         General Surgery         [address], [address]         [place], [place] [address]         p [phone] f [phone]         [email or web address]            July 30, 2009              RE: Tamara Cox  DOB:   1971/01/02      Dear [name], MD ,    I had the pleasure of seeing your patient, Tamara Cox in the office today for follow-up care. Enclosed is a copy of my office note and recommendations regarding this patient.    Thank you for the opportunity to participate in Tamara Cox's care and please do not hesitate to contact me with any questions.    Sincerely,        Deeann Dowse, MD          CC

## 2009-08-07 NOTE — Unmapped (Signed)
Signed by Damien Fusi on 08/07/2009 at 16:35:56    PHONE NOTE  Caller's Cell Phone #: [phone]  Caller: patient  Call for: Tamyrah Burbage    Reason for Call: pt had  hernia surgery 07/16/09  and  for the last 2 days been trying to keep her incision dry however she is having a yellowish drainage      Initial call taken by: Nita Sells,  August 07, 2009 3:10 PM      FOLLOW UP  lmom told to go to ER if fevers chills or nausea if we are not here when she calls back.  Follow-up by:  Damien Fusi,  August 07, 2009 4:35 PM

## 2009-08-13 NOTE — Unmapped (Signed)
Signed by Deeann Dowse MD on 08/13/2009 at 15:30:37    General Surgery Postoperative Visit    POST-OP VISIT   Date of Surgery: 07/16/2009     Procedure:  Incisional hernia repair with mesh    Complaints: None.       Details:  drainage  PAST HISTORY  Surgical History (reviewed - no changes required):  Hernia Surgery: ventral 05/2007/  Incisional hernia repair with mesh. 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    Weight: 297 lbs.   Weight Change since last visit (lbs): -1  BMI (in-lb): 44.02   BSA (m2): 2.45  Temperature: 98.2 degrees  F     Allergies  ! * PENICILLIN DRUGS  ! CODEINE PHOSPHATE  ! * SULFA  ! * QUINALONES  Allergy and adverse reaction list reviewed during this update.      Medications   LEVOTHROID 75 MCG TABS (LEVOTHYROXINE SODIUM)   JANUMET  TABS (SITAGLIPTIN-METFORMIN HCL TABS)   PROZAC 10 MG CAPS (FLUOXETINE HCL)   BYETTA 5 MCG PEN  SOLN (EXENATIDE SOLN) once daily  PROMETHAZINE HCL 25 MG TABS (PROMETHAZINE HCL) as needed nausea  PERCOCET  TABS (OXYCODONE-ACETAMINOPHEN TABS)     Intake recorded by: Damien Fusi  August 13, 2009 12:07 PM        Physical Exam   General Appearance: well-developed, well-nourished and in no acute distress    Respiratory   Respiratory Effort: breathing comfortably, no increased work of breathing  Percussion: symmetric, no dullness or hyperresonance  Palpation: symmetric tactile fremitus  Auscultation: CTA bilaterally with symmetric breath sounds, good air movement, no crackles bronchi or wheezing noted    Cardiovascular   Palpation: no thrill, no displacement of PMI  Auscultation: regular rate and rhythm, S1, S2, no murmur, rub, or gallop  Carotid arteries: pulses 2+, symmetric, no bruits  Abdominal aorta: no enlargement or bruits, no  tenderness  Femoral arteries: pulses 2+, symmetric, no bruits  Pedal pulse: DP/PT pulses 2+, symmetric, radial pulse 2+ symmetric, brachial pulse 2+ symmetric  Peripheral Circulation: no cyanosis, clubbing or edema, capillary refill brisk, well perfused, no ulceration    Gastrointestinal   Abdomen: soft, non-tender, non-distended, no masses,  bowel sounds present, no rebound tenderness, no guarding, no organomegaly  Liver and Spleen: no enlargement or nodularity  Hernia: no hernias    Assessment and Plan   39 y/o woman who is post operative from an incisional hernia repair, doing well at this time, the incisions are well healed and pain is controlled. The patient has returned to a regular diet and continues to avoid lifting over 8 pounds for a total of six weeks after the operation. return in 2 weeks for wound check.       Today's Orders   54098 - Post-op Follow-up Visit [CPT-99024]    cc:   [name], MD    Medical Decision-Making     Risk of complications: moderate            ]

## 2009-08-14 NOTE — Unmapped (Signed)
Signed by Dionne Ano on 08/14/2009 at [facility]                   [facility]         General Surgery         [address]         [place], [place] [address]         p [phone] f [phone]         [email or web address]            August 13, 2009              RE: [name]  DOB:   10/17/1970      Dear [name], MD,       I had the pleasure of seeing your patient, Tamara Cox in the office today for follow-up care. Enclosed is a copy of my office note and recommendations regarding this patient.    Thank you for the opportunity to participate in Tamara Cox's care and please do not hesitate to contact me with any questions.    Sincerely,        Deeann Dowse, MD          CC

## 2009-08-27 NOTE — Unmapped (Signed)
Signed by Dionne Ano on 08/27/2009 at [facility]                   [facility]         General Surgery         [address]         [place], [place] [address]         p [phone] f [phone]         [email or web address]            August 27, 2009              RE: Tamara Cox  DOB:   05-09-1971      Dear [name], MD ,    I had the pleasure of seeing your patient, Tamara Cox in the office today for follow-up care. Enclosed is a copy of my office note and recommendations regarding this patient.    Thank you for the opportunity to participate in Ms. Wonders's care and please do not hesitate to contact me with any questions.    Sincerely,        Deeann Dowse, MD          CC

## 2009-08-27 NOTE — Unmapped (Signed)
Signed by Deeann Dowse MD on 08/27/2009 at 10:02:27    General Surgery Postoperative Visit    POST-OP VISIT   Date of Surgery: 07/16/2009     Procedure:  Incisional hernia repair with mesh    Complaints: Fatigue.    PAST HISTORY  Surgical History (reviewed - no changes required):  Hernia Surgery: ventral 05/2007/  Incisional hernia repair with mesh. 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    Weight: 302.8 lbs.   Weight Change since last visit (lbs): 5.80  BMI (in-lb): 44.88   BSA (m2): 2.47  Temperature: 98.8 degrees  F     Allergies  ! * PENICILLIN DRUGS  ! CODEINE PHOSPHATE  ! * SULFA  ! * QUINALONES  Allergy and adverse reaction list reviewed during this update.      Medications   LEVOTHROID 75 MCG TABS (LEVOTHYROXINE SODIUM)   JANUMET  TABS (SITAGLIPTIN-METFORMIN HCL TABS)   PROZAC 10 MG CAPS (FLUOXETINE HCL)   BYETTA 5 MCG PEN  SOLN (EXENATIDE SOLN) once daily  PROMETHAZINE HCL 25 MG TABS (PROMETHAZINE HCL) as needed nausea  PERCOCET  TABS (OXYCODONE-ACETAMINOPHEN TABS)     Intake recorded by: Damien Fusi  August 27, 2009 9:46 AM    Medications reviewed, updated and verified with patient or patient representative.    Screening for unhealthy alcohol use performed.  Women (Any Age) or Men (over Age 37): nondrinker      Physical Exam   General Appearance: well-developed, well-nourished and in no acute distress    Respiratory   Respiratory Effort: breathing comfortably, no increased work of breathing  Percussion: symmetric, no dullness or hyperresonance  Palpation: symmetric tactile fremitus  Auscultation: CTA bilaterally with symmetric breath sounds, good air movement, no crackles bronchi or wheezing noted    Cardiovascular   Palpation: no thrill, no displacement of  PMI  Auscultation: regular rate and rhythm, S1, S2, no murmur, rub, or gallop  Carotid arteries: pulses 2+, symmetric, no bruits  Abdominal aorta: no enlargement or bruits, no tenderness  Femoral arteries: pulses 2+, symmetric, no bruits  Pedal pulse: DP/PT pulses 2+, symmetric, radial pulse 2+ symmetric, brachial pulse 2+ symmetric  Peripheral Circulation: no cyanosis, clubbing or edema, capillary refill brisk, well perfused, no ulceration    Gastrointestinal   Abdomen: soft, non-tender, non-distended, no masses,  bowel sounds present, no rebound tenderness, no guarding, no organomegaly  Liver and Spleen: no enlargement or nodularity  Hernia: no hernias    Assessment and Plan   39 y/o woman who is post operative from an incisional hernia repair, doing well at this time, the incisions are well healed and pain is controlled. The patient has returned to a regular diet and continues to avoid lifting over 8 pounds for a total of six weeks after the operation. No need for further return to the office unless problems arise; the patient expresses understanding and agrees to this plan.       Today's Orders   [phone] - Patient Encounter Documented Using an EHR Certified by [facility] [*CPT-G8447]  [phone] - Current Medications with Name, Dosages, Frequency and Route Documented [*CPT-G8427]  3016F - Unhealthy Alcohol Use Screening Performed [*CPT-3016F]  27253 - Post-op Follow-up  Visit [CPT-99024]    cc:   [name], MD    Medical Decision-Making     Risk of complications: moderate            ]

## 2009-08-31 ENCOUNTER — Inpatient Hospital Stay

## 2009-08-31 NOTE — Unmapped (Signed)
Signed by Dionne Ano on 08/31/2009 at [facility]                   [facility]         General Surgery         [address]         [place], [place] [address]         p [phone] f [phone]         [email or web address]            August 31, 2009              RE: Tamara Cox  DOB:   Dec 10, 1970      Dear [name], MD ,    I had the pleasure of seeing your patient, [name] in the office today for follow-up care. Enclosed is a copy of my office note and recommendations regarding this patient.    Thank you for the opportunity to participate in [name]'s care and please do not hesitate to contact me with any questions.    Sincerely,        Deeann Dowse, MD          CC

## 2009-08-31 NOTE — Unmapped (Signed)
Signed by Deeann Dowse MD on 08/31/2009 at 15:03:35    General Surgery Post-Op Visit    POST-OP VISIT   Date of Surgery: 07/16/2009     Procedure:  Incisional hernia repair with mesh    Complaints: Pain, swelling, fever, fatigue.       Details:  Patient states that her incision had a blood blister.  She popped the blister and there was blood and blood clots that was draining out of the site. She is now having clear pus drainage.  PAST HISTORY  Past Medical History:  Diabetes Type II, Hypothyroidism, Depression  Family History: MGM - Diabetes  PGM - Diabetes  Social History: Preferred Language: English,   Marital Status: married,   Children: 3,   Employment Status: homemaker,   Patient Lives at: home,   Support System: adequate  Seatbelt Use: 100 % of the time  Alcohol Use: none  Drug Use: none  Tobacco Usage:non-smoker        Vital Signs   Height: 69 in.    Weight: 302 lbs.   Weight Change since last visit (lbs): -0.80  BMI (in-lb): 44.76   BSA (m2): 2.46  Respirations: 20   Pulse rate: 80   Pulse rhythm: regular   Temperature: 99.6 degrees  F     Allergies  ! * PENICILLIN DRUGS  ! CODEINE PHOSPHATE  ! * SULFA  ! * QUINALONES  Allergy and adverse reaction list reviewed during this update.      Medications   LEVOTHROID 75 MCG TABS (LEVOTHYROXINE SODIUM)   JANUMET  TABS (SITAGLIPTIN-METFORMIN HCL TABS)   PROZAC 10 MG CAPS (FLUOXETINE HCL)   BYETTA 5 MCG PEN  SOLN (EXENATIDE SOLN) once daily  PROMETHAZINE HCL 25 MG TABS (PROMETHAZINE HCL) as needed nausea  PERCOCET  TABS (OXYCODONE-ACETAMINOPHEN TABS)     Intake recorded by: Glennon Hamilton MA  August 31, 2009 12:59 PM    Medications reviewed, updated and verified with patient or patient representative.    Screening for unhealthy alcohol use performed.  Previous Screening:   Screening for unhealthy alcohol use performed. (08/27/2009 9:46:10 AM)  Women (Any Age) or Men (over Age 84): nondrinker      Physical Exam   General Appearance: well-developed, well-nourished  and in no acute distress    Gastrointestinal   Abdomen: no recurrent hernia, serous fluid drainage from incision, no signs of infection      Assessment and Plan   39 y/o woman 7 weeks s/p incisional hernia repair, seroma draining clear fluid, no signs of infection, will obtain CT scan to assess fluid.     cc:   [name], MD            ]

## 2009-08-31 NOTE — Unmapped (Signed)
Signed by   [employer]Log[employer] on 08/31/2009 at 14:40:19  Patient: Tamara Cox  Note: All result statuses are Final unless otherwise noted.    Tests: (1) CT-PELVIS W/O CONT ([id number])    Order Note: [employer]Net Accession Number: [id number]    Order Note:                                 [name]     Patient: Tamara Cox   DOB:     [birth year]   MRN:     [id number]   FIN:       [id number]   Accn#:   [id number]                                   Computed Tomography     Exam                       Exam Date/Time       Ordering Physician   CT-PELVIS W/O CONT         08/31/2009 13:43 EST Kyaira Trantham L     Reason for Exam   abdominal incisional hernia     Report   CT of the pelvis without contrast dated 08/31/2009     Comparison: 06/19/2009     Indication: Status post hernia repair on 07/16/2009. Evaluate for fluid   collection around the incision.     Technical factors:     Helically acquired axial sections were obtained through the pelvis without   the administration of oral or intravenous contrast. The field-of-view was   39 cm. Images were reconstructed 5 mm slice thickness.     Findings:     There has been interval repair of a midline ventral hernia in the anterior   abdominal wall. There is now an ill-defined predominantly low attenuation   soft tissue mass in the same area of the abdominal wall which measures   approximately 6.9 x 4.7 cm transversely and extends over an approximately 7   cm distance from cranial to caudal. This mass contains a few tiny bubbles   of gas. There is also a small bubble of gas underlying the scan of the   anterior abdominal wall. A portion of this area along the perineal surface   of the abdominal wall is lower attenuation and more well defined consistent   with a small fluid collection. This has transverse dimensions of at least   2.5 x 1.5 cm and extends over at least a 5.5 cm cranial caudal distance. No   abnormalities of the underlying large or small bowel are appreciated   although loop of small bowel does approach this area. There is no free   intraperitoneal fluid.      There has been previous hysterectomy. The bladder is not fully distended   but otherwise unremarkable. No pelvic adenopathy is apparent.     Bone windows show no concerning lytic or blastic lesions.     Impression:     Status post ventral hernia repair with ill-defined soft tissue mass in the   same area of the anterior abdominal wall likely representing  postsurgical   change and potential inflammatory or infectious change as well. The   presence of some small bubbles of gas in this area raises the question of   gas forming infection.  There is also a small fluid collection along the   peritoneal surface of the abdominal wall as described above. The size and   extent of the fluid collection are difficult to fully assess without   contrast and would be better evaluated with contrast-enhanced CT.   ********** VERIFIED REPORT **********     Dictated: 08/31/2009 2:31 pm      [facility] M.D., Rosita Kea   Signed (Electronic Signature):  [facility] M.D., VIRGINIA M       08/31/09   2:42     Technologist: EGW    Order Note:   EMR Routing to: Laurence Aly - ordering - [id number]    Note: An exclamation mark (!) indicates a result that was not dispersed into   the flowsheet.  Document Creation Date: 08/31/2009 2:40 PM  _______________________________________________________________________    (1) Order result status: Final  Collection or observation date-time: 08/31/2009 13:43:32  Requested date-time: 08/31/2009 13:37:00  Receipt date-time:   Reported date-time: 08/31/2009 14:42:00  Referring Physician: Deeann Dowse  Ordering Physician: Deeann Dowse (HUSTET)  Specimen Source: RAD&Rad Type  Source: QRS  Filler Order Number: [id number]  Lab site: [facility]

## 2009-08-31 NOTE — Unmapped (Signed)
Signed by Huntley Estelle on 08/31/2009 at 13:22:18    Clinical Lists Changes    Orders:  Added new Test order of CT, Pelvis w/o contrast ([id number]) - Signed

## 2009-09-03 NOTE — Unmapped (Signed)
Signed by Dionne Ano on 09/03/2009 at 09:44:03                   [facility]         General Surgery         [address]         [place], [place] [address]         p [phone] f [phone]         [email or web address]            September 03, 2009              RE: [name]  DOB:   [birth year]      Dear  [name], MD,    I had the pleasure of seeing your patient, Tamara Cox in the office today for follow-up care. Enclosed is a copy of my office note and recommendations regarding this patient.    Thank you for the opportunity to participate in Tamara Cox care and please do not hesitate to contact me with any questions.    Sincerely,        Deeann Dowse, MD          CC

## 2009-09-03 NOTE — Unmapped (Signed)
Signed by Deeann Dowse MD on 09/03/2009 at 09:06:29    General Surgery Postoperative Visit    POST-OP VISIT   Date of Surgery: 07/16/2009     Procedure:  Incisional hernia repair with mesh    Complaints: Pain.    PAST HISTORY  Past Medical History (reviewed - no changes required):  Diabetes Type II, Hypothyroidism, Depression  Surgical History (reviewed - no changes required):  Hernia Surgery: ventral 05/2007/  Incisional hernia repair with mesh. 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    Weight: 298.5 lbs.   Weight Change since last visit (lbs): -3.50  BMI (in-lb): 44.24   BSA (m2): 2.45  Temperature: 97.8 degrees  F     Allergies  ! * PENICILLIN DRUGS  ! CODEINE PHOSPHATE  ! * SULFA  ! * QUINALONES  Allergy and adverse reaction list reviewed during this update.      Medications   LEVOTHROID 75 MCG TABS (LEVOTHYROXINE SODIUM)   JANUMET  TABS (SITAGLIPTIN-METFORMIN HCL TABS)   PROZAC 10 MG CAPS (FLUOXETINE HCL)   BYETTA 5 MCG PEN  SOLN (EXENATIDE SOLN) once daily  PROMETHAZINE HCL 25 MG TABS (PROMETHAZINE HCL) as needed nausea  PERCOCET  TABS (OXYCODONE-ACETAMINOPHEN TABS)     Intake recorded by: Damien Fusi  September 03, 2009 8:52 AM    Medications reviewed, updated and verified with patient or patient representative.    Screening for unhealthy alcohol use performed.  Previous Screening:   Screening for unhealthy alcohol use performed. (08/31/2009 12:53:16 PM)  Women (Any Age) or Men (over Age 55): nondrinker      Physical Exam   General Appearance:  well-developed, well-nourished and in no acute distress    Gastrointestinal   Hernia: no hernia, draining seroma    Assessment and Plan   39 y/o woman 8 weeks a/p incisional hernia, still draining seroma, no recuurence of hernia, will follow for now, return in 2 weeks.       Today's Orders   [phone] - Patient Encounter Documented Using an EHR Certified by [facility] [*CPT-G8447]  [phone] - Current Medications with Name, Dosages, Frequency and Route Documented [*CPT-G8427]  3016F - Unhealthy Alcohol Use Screening Performed [*CPT-3016F]  09811 - Post-op Follow-up Visit [CPT-99024]    cc:   [name], MD    Medical Decision-Making     Amount/complexity of data to be reviewed   * review/order radiology tests    Risk of complications: moderate            ]

## 2009-09-10 LAB — POC URINALYSIS
Glucose, UA: NEGATIVE g/dL
Ketones, UA: >=160
Nitrite, UA: NEGATIVE
Specific Gravity, UA: 1.025 (ref 1.005–1.035)
Urobilinogen, UA: 1 E units/dL (ref 0.2–1.0)
pH, UA: 6 (ref 5.0–8.0)

## 2009-09-10 NOTE — Unmapped (Signed)
Signed by Deeann Dowse MD on 09/10/2009 at 18:21:55  Patient: Tamara Cox  Note: All result statuses are Final unless otherwise noted.    Tests: (1) POC URINALYSIS (POCUA)    Glucose, Urine            Negative mg/dL              Negative    Bilirubin, Urine     [A]  Moderate                    Negative    Ketone               [A]  >=160 mg/dL                 Negative    Specific Gravity          1.025                       1.005-1.035    Blood                [A]  Trace-intact                Negative    pH                        6.0                         5.0-8.0    Protein, urine       [A]  Trace mg/dL                 Negative    Urobilinogen              1.0 mg/dL                   2.4-4.0    Nitrite                   Negative                    Negative    Leukocyte Esterase   [A]  Small                       Negative    Note: An exclamation mark (!) indicates a result that was not dispersed into   the flowsheet.  Document Creation Date: 09/10/2009 3:50 PM  _______________________________________________________________________    (1) Order result status: Final  Collection or observation date-time: 09/10/2009 15:38  Requested date-time: 09/10/2009 15:38  Receipt date-time: 09/10/2009 16:05  Reported date-time: 09/10/2009 15:50  Referring Physician: Deeann Dowse  Ordering Physician: Deeann Dowse (HUSTET)  Specimen Source: U&URINE     UACUP&URINALYSIS CONTAINER  Source: [name] Order Number: [id number]  Lab site: [facility]      [address]      [place] [address]  [phone]      -----------------    The following lab values were dispersed to the flowsheet  with no units conversion:      Urobilinogen, 1.0 MG/DL, (F)  expected units: E units/dL    -----------------    The following non-numeric lab results were dispersed to  the flowsheet even though numeric results were expected:      Glucose, Urine, Negative

## 2009-09-17 NOTE — Unmapped (Signed)
Signed by Dionne Ano on 09/17/2009 at 10:17:15                   [facility]         General Surgery         [address], [address]         [place], [place] [address]         p [phone] f [phone]         [email or web address]            September 17, 2009              RE: CHANTE MAYSON  DOB:   March 14, 1971      Dear [name], MD ,    I had the pleasure of seeing your patient, Itzia Cunliffe in the office today for follow-up care. Enclosed is a copy of my office note and recommendations regarding this patient.    Thank you for the opportunity to participate in Ms. Rout's care and please do not hesitate to contact me with any questions.    Sincerely,        Deeann Dowse, MD          CC

## 2009-09-17 NOTE — Unmapped (Signed)
Signed by Deeann Dowse MD on 09/17/2009 at 09:26:31    General Surgery Postoperative Visit    POST-OP VISIT   Date of Surgery: 07/16/2009     Procedure:  Incisional hernia repair with mesh    Complaints: None.    PAST HISTORY  Past Medical History (reviewed - no changes required):  Diabetes Type II, Hypothyroidism, Depression  Surgical History (reviewed - no changes required):  Hernia Surgery: ventral 05/2007/  Incisional hernia repair with mesh. 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    Weight: 304 lbs.   Weight Change since last visit (lbs): 5.50  BMI (in-lb): 45.06   BSA (m2): 2.47  Pulse rhythm: regular       Allergies  ! * PENICILLIN DRUGS  ! CODEINE PHOSPHATE  ! * SULFA  ! * QUINALONES  Allergy and adverse reaction list reviewed during this update.      Medications   LEVOTHROID 75 MCG TABS (LEVOTHYROXINE SODIUM)   JANUMET  TABS (SITAGLIPTIN-METFORMIN HCL TABS)   PROZAC 10 MG CAPS (FLUOXETINE HCL)   BYETTA 5 MCG PEN  SOLN (EXENATIDE SOLN) once daily  PROMETHAZINE HCL 25 MG TABS (PROMETHAZINE HCL) as needed nausea  PERCOCET  TABS (OXYCODONE-ACETAMINOPHEN TABS)     Intake recorded by: Damien Fusi  September 17, 2009 9:11 AM    Medications reviewed, updated and verified with patient or patient representative.    Screening for unhealthy alcohol use performed.  Previous Screening:   Screening for unhealthy alcohol use performed. (09/03/2009 8:51:19 AM)      Physical Exam   General Appearance: well-developed, well-nourished and in no acute  distress    Respiratory   Respiratory Effort: breathing comfortably, no increased work of breathing  Percussion: symmetric, no dullness or hyperresonance  Palpation: symmetric tactile fremitus  Auscultation: CTA bilaterally with symmetric breath sounds, good air movement, no crackles bronchi or wheezing noted    Cardiovascular   Palpation: no thrill, no displacement of PMI  Auscultation: regular rate and rhythm, S1, S2, no murmur, rub, or gallop  Carotid arteries: pulses 2+, symmetric, no bruits  Abdominal aorta: no enlargement or bruits, no tenderness  Femoral arteries: pulses 2+, symmetric, no bruits  Pedal pulse: DP/PT pulses 2+, symmetric, radial pulse 2+ symmetric, brachial pulse 2+ symmetric  Peripheral Circulation: no cyanosis, clubbing or edema, capillary refill brisk, well perfused, no ulceration    Gastrointestinal   Abdomen: soft, non-tender, non-distended, no masses,  bowel sounds present, no rebound tenderness, no guarding, no organomegaly  Liver and Spleen: no enlargement or nodularity  Hernia: no hernias    Assessment and Plan   39 y/o woman who is post operative from an incisional hernia repair, doing well at this time, the incisions are well healed and pain is controlled. The patient has returned to a regular diet and continues to avoid lifting over 8 pounds for a total of six weeks after the operation. No need for further return to  the office unless problems arise; the patient expresses understanding and agrees to this plan.       Today's Orders   [phone] - Patient Encounter Documented Using an EHR Certified by [employer] [*CPT-G8447]  [phone] - Current Medications with Name, Dosages, Frequency and Route Documented [*CPT-G8427]  3016F - Unhealthy Alcohol Use Screening Performed [*CPT-3016F]  65784 - Post-op Follow-up Visit [CPT-99024]    cc:   [name], MD    Medical Decision-Making     Risk of complications: moderate            ]

## 2009-10-31 LAB — URIC ACID: Uric Acid, Serum: 5.6 mg/dl (ref 2.6–6.0)

## 2009-10-31 LAB — TSH
T4 Free: 1.04 ng/dl (ref 0.9–1.8)
TSH: 2.7 u[IU]/mL (ref 0.35–5.5)

## 2009-10-31 LAB — SEDIMENTATION RATE, AUTOMATED: Sed Rate: 22 mm/Hr — ABNORMAL HIGH (ref 0–20)

## 2009-10-31 LAB — T4, FREE: T4 Free: 1.04 ng/dl (ref 0.9–1.8)

## 2009-10-31 LAB — RHEUMATOID FACTOR: Rheumatoid Factor Quant: <10 IU/ml (ref 0–14)

## 2009-10-31 NOTE — Unmapped (Signed)
[facility]     PATIENT NAME:   Tamara Cox, Tamara Cox                  MR #:  [id number]   DATE OF BIRTH:  04-03-1971                        ACCOUNT #:  [id number]   ED PHYSICIAN:   Phylliss Blakes, M.D.            ROOM #:   PRIMARY:        Knute Neu. Duanne Limerick, M.D.            NURSING UNIT:  ED   REFERRING:                                        FC:  C   DICTATED BY:    Phylliss Blakes, M.D.            ADMIT DATE:  10/30/2009   VISIT DATE:     10/30/2009                        DISCHARGE DATE:                               EMERGENCY DEPARTMENT NOTE     *-*-*     ***ADDENDUM -- 10/31/2009 -- mkjf***     CHIEF COMPLAINT:  Left ankle pain.     A 39 year old female seen and evaluated today by Evelene Croon under my   direct supervision.  I agree with the above-dictated history and physical,   contributed to the medical decision making.  I reviewed the patient's x-ray.   She is being discharged home.     DIAGNOSIS:     1.  Foot pain.       *-*-*                                             _______________________________________   MR/jf                                  _____   D:  10/30/2009 22:19                  Phylliss Blakes, M.D.   T:  10/31/2009 11:31   Job #:  [id number]                                    EMERGENCY DEPARTMENT NOTE                                                                PAGE    1 of   1   MR/jf  _____   D:  10/30/2009 22:19                  Phylliss Blakes, M.D.   T:  10/31/2009 11:31   Job #:  [id number]                                    EMERGENCY DEPARTMENT NOTE                                                                PAGE    1 of   1

## 2009-10-31 NOTE — Unmapped (Signed)
[facility]     PATIENT NAME:   Tamara Cox, Tamara Cox                  MR #:  [id number]   DATE OF BIRTH:  01/06/1971                        ACCOUNT #:  [id number]   ED PHYSICIAN:   Phylliss Blakes, M.D.            ROOM #:   PRIMARY:        Knute Neu. Duanne Limerick, M.D.            NURSING UNIT:  ED   REFERRING:                                        FC:  C   DICTATED BY:    Evelene Croon, P.A.              ADMIT DATE:  10/30/2009   VISIT DATE:                                       DISCHARGE DATE:                               EMERGENCY DEPARTMENT NOTE     *-*-*     CHIEF COMPLAINT:  Ankle injury.     HISTORY OF PRESENT ILLNESS:  The patient is a very pleasant 39 year old   female who presents for right foot and ankle pain.  She has had some degree   of symptoms off and on for the last three weeks or so.  She is status post   right knee surgery a couple months ago by Dr. Orlie Dakin.  She has done very   well from a postoperative standpoint, but has developed this foot pain over   the last couple of weeks.  She is concerned that it may be a sprain versus a   stress fracture.  She does not recall any blunt injury or trauma.  She does   not recall rolling or twisting her ankle.     PAST MEDICAL HISTORY:     1.  Diabetes.   2.  Knee surgery with left knee arthritis as well.   3.  Hysterectomy.     SOCIAL HISTORY:  She does not use alcohol, tobacco, or illicit drugs.     MEDICATIONS:  See ED notes.     ALLERGIES:     1.  Penicillin.   2.  Sulfa.   3.  Codeine.   4.  Quinolones.     REVIEW OF SYSTEMS:  As mentioned, otherwise negative.     PHYSICAL EXAMINATION:     VITAL SIGNS:  Blood pressure 130/95, pulse ___, respirations 20, temperature   99, O2 sat 100% on room air.   GENERAL:  She is awake, alert, and oriented, in no acute distress.   CHEST:  Clear breath sounds bilaterally with no wheezes, rales, or rhonchi.   HEART:  Regular rate and rhythm with no murmurs, rubs, or gallops.   ABDOMEN:   Soft, nontender, nondistended, good bowel sounds with no   organomegaly.   EXTREMITIES:  She has  some tenderness to palpation on the right ankle, not   necessarily at the distal tibia or fibula.  It is just inferior to this area   in the area of the tibiotalar ligament.  There is no appreciable swelling, no   bruising, no open areas to the skin.  Achilles tendon intact via Thompson's   test and palpation.     EMERGENCY DEPARTMENT COURSE:  This young lady is seen and evaluated in the   emergency room for right foot and ankle pain.     X-RAY:  X-rays done of the right foot and ankle are negative for acute bony   abnormality.  At this time, we will place her in an air splint.  She has had   Percocet in the past.  This is certainly okay at this time.  She will follow   up with Dr. Orlie Dakin or with a podiatrist. She will return here if anything   worsens or changes.  She is advised basically nonweightbearing or toe   touching for the next week or so.  If she still has pain, she is advised   follow up for a possible MRI or podiatry evaluation.     DIAGNOSIS:     1.  Foot pain.     PLAN:     1.  Rest, ice, compression, elevation.   2.  Anti-inflammatories are okay.   3.  Percocet as needed for severe pain.   4.  Return if anything worsens.       *-*-*                                             _______________________________________   RA/jf                                  _____   D:  10/30/2009 18:26                  Evelene Croon, P.A.   T:  10/31/2009 09:14   Job #:  [id number]                                         _______________________________________                                          _____                                         Phylliss Blakes, M.D.                                  EMERGENCY DEPARTMENT NOTE  PAGE    1 of   1                                  EMERGENCY DEPARTMENT NOTE                                                                 PAGE    1 of   1

## 2009-11-02 LAB — ANA: ANA: NEGATIVE

## 2009-11-04 NOTE — Telephone Encounter (Signed)
CHECKING STATUS ON RECORDS REQUEST

## 2009-11-04 NOTE — Telephone Encounter (Signed)
Called  And let Selena BattenKim know that we have no records for this patient.

## 2009-11-07 LAB — COMPREHENSIVE METABOLIC PANEL
ALT: 39 U/L (ref 10–40)
AST: 30 U/L (ref 15–37)
Albumin/Globulin Ratio: 1.7 (ref 1.1–2.2)
Albumin: 4.3 gm/dl (ref 3.4–5.0)
Alkaline Phosphatase: 134 U/L — ABNORMAL HIGH (ref 45–129)
BUN: 16 mg/dl (ref 7–18)
CO2: 27 mEq/L (ref 21–32)
Calcium: 9.1 mg/dl (ref 8.3–10.6)
Chloride: 105 mEq/L (ref 99–110)
Creatinine: 0.7 mg/dl (ref 0.6–1.1)
GFR Est, African/Amer: >60
GFR, Estimated: >60 (ref >60)
Glucose: 154 mg/dl — ABNORMAL HIGH (ref 70–99)
Potassium: 4.4 mEq/L (ref 3.5–5.1)
Sodium: 142 mEq/L (ref 136–145)
Total Bilirubin: 0.4 mg/dl (ref 0.0–1.0)
Total Protein: 6.8 gm/dl (ref 6.4–8.2)

## 2009-11-07 LAB — CBC WITH AUTO DIFFERENTIAL
Basophils %: 0.5 % (ref 0.0–2.0)
Basophils Absolute: 0 10*3 (ref 0.0–0.2)
Eosinophils %: 1.7 % (ref 0.0–5.0)
Eosinophils Absolute: 0.1 10*3 (ref 0.0–0.6)
Granulocyte Absolute Count: 4.5 10*3 (ref 1.7–7.7)
Hematocrit: 40.4 % (ref 36.0–48.0)
Hemoglobin: 13.2 gm/dl (ref 12.0–16.0)
Lymphocytes %: 25.5 % (ref 25.0–40.0)
Lymphocytes Absolute: 1.8 10*3 (ref 1.0–5.1)
MCH: 28.3 pg (ref 26–34)
MCHC: 32.7 gm/dl (ref 31–36)
MCV: 86.3 fl (ref 80–100)
MPV: 9.9 fl (ref 5.0–10.5)
Monocytes %: 7.3 % (ref 0.0–12.0)
Monocytes Absolute: 0.5 10*3 (ref 0.0–1.3)
Platelets: 210 10*3 (ref 135–450)
RBC: 4.68 10*6 (ref 4.0–5.2)
RDW: 16.1 % — ABNORMAL HIGH (ref 11.5–14.5)
Segs Relative: 65 % — ABNORMAL HIGH (ref 42.0–63.0)
WBC: 7 10*3 (ref 4.0–11.0)

## 2009-11-07 LAB — LIPID PANEL
Cholesterol, Total: 164 mg/dl (ref <200)
HDL: 35 mg/dl — ABNORMAL LOW (ref 40–60)
LDL Calculated: 110 mg/dl — ABNORMAL HIGH (ref <100)
Triglycerides: 99 mg/dl (ref <150)
VLDL Cholesterol Calculated: 20 mg/dl

## 2009-11-11 NOTE — Unmapped (Signed)
Signed by Sabino Donovan LPN on 95/18/8416 at 10:54:36    Preload      Preload Clinical Lists   Problems:   ABDOMINAL INCISIONAL HERNIA (ICD-553.21)  ABDOMINAL PAIN (ICD-789.00)  INCISIONAL HERNIA (ICD-553.21)    Medications:   LEVOTHROID 75 MCG TABS (LEVOTHYROXINE SODIUM)   JANUMET  TABS (SITAGLIPTIN-METFORMIN HCL TABS)   PROZAC 10 MG CAPS (FLUOXETINE HCL)   BYETTA 5 MCG PEN  SOLN (EXENATIDE SOLN) once daily  PROMETHAZINE HCL 25 MG TABS (PROMETHAZINE HCL) as needed nausea  PERCOCET  TABS (OXYCODONE-ACETAMINOPHEN TABS)       Allergies:  ! * PENICILLIN DRUGS  ! CODEINE PHOSPHATE  ! * SULFA  ! * QUINALONES  Observations Recorded during this update  Added new observation of SH REVIEWED: reviewed - no changes required (11/11/2009 10:54)  Added new observation of FH REVIEWED: reviewed - no changes required (11/11/2009 10:54)  Added new observation of PSH REVIEWED: reviewed - no changes required (11/11/2009 10:54)  Added new observation of PMH REVIEWED: reviewed - no changes required (11/11/2009 10:54)    PAST HISTORY  Past Medical History (reviewed - no changes required):  Diabetes Type II, Hypothyroidism, Depression  Surgical History (reviewed - no changes required):  Hernia Surgery: ventral 05/2007/  Incisional hernia repair with mesh. 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

## 2010-01-06 NOTE — Unmapped (Signed)
Signed by Annabelle Harman MD on 01/07/2010 at 15:04:22    I called pt at number listed to request records from Endo MD. Unable to leave message. No MD listed. Unable to have records for pts appt.

## 2010-01-07 NOTE — Unmapped (Signed)
Signed by   LinkLogic on 01/07/2010 at 10:22:10    Appointment status changed to no show by  LinkLogic on 01/07/2010 10:22 AM.    No Show Comments  ----------------      Appointment Information  -----------------------  Appt Type:         Date:  Thursday, January 07, 2010       Time:  10:00 AM for 40 min    Urgency:  Routine    Made By:  LinkLogic   To Visit:  Annabelle Harman MD     Reason:  (UP) NEW PT TO BUILDING    Appt Comments  -------------  -- 01/07/10 10:22: (122LHOY) NO SHOW --  DIABETES AND HYPOTHYROIDISM; NPBD (UP) NEW PT IN BUILDING    -- 11/13/09 17:46: (023KE) BOOKED --  Routine  at 01/07/2010 10:00 AM for 40 min  (UP) NEW PT TO BUILDING  DIABETES AND HYPOTHYROIDISM; NPBD (UP) NEW PT IN

## 2010-01-23 NOTE — Unmapped (Signed)
[facility]     PATIENT NAME:   Tamara Cox, Tamara Cox                  MR #:  [id number]   DATE OF BIRTH:  July 29, 1970                        ACCOUNT #:  [id number]   ED PHYSICIAN:   Phylliss Blakes, M.D.            ROOM #:   PRIMARY:        Knute Neu. Duanne Limerick, M.D.            NURSING UNIT:  ED   REFERRING:                                        FC:  C   DICTATED BY:    Georgiann Mccoy, PA-C               ADMIT DATE:  01/22/2010   VISIT DATE:     01/22/2010                        DISCHARGE DATE:                               EMERGENCY DEPARTMENT NOTE     *-*-*     CHIEF COMPLAINT:  Possible foot fracture.     HISTORY OF PRESENT ILLNESS:  This is a 39 year old typically healthy female   who was going to put her foot into test the water of the pool today when she   simply misjudged a step and fell in twisting her right foot underneath there   as she fell the rest of the way into the pool, fully clothed.  This happened   just prior to her arrival today and she had comes in infact with dripping wet   clothing on.  She cannot bear weight on her right foot and complains of pain   to the lateral aspect.  No numbness or tingling to the extremity.  She   coincidentally just finished rehabing this exact same foot for plantar   fasciitis and has a custom made orthotic boot and crutches at home and   follows with Dr. Waymon Budge of the podiatry service.  She denies other injury   related to the fall as likely she fell into a pool.     PAST MEDICAL HISTORY:     1. Diabetes.   2. Hernia repair.   3. Meniscus repair and then this recent foot injury.     MEDICATIONS:  Denied any medications.     ALLERGIES:     1. Penicillin.   2. Codeine.   3. Sulfa.   4. Quinolones.     FAMILY HISTORY:  Not elicited.     SOCIAL HISTORY:  No tobacco, alcohol, or illicit drug use.     PHYSICAL EXAMINATION:     VITAL SIGNS:  Temperature is 98.5, blood pressure is 146/95 with a pulse of   90, respiratory rate is 20, O2  saturation is 96% on room air.   GENERAL:  This is a 39 year old obese white female in no acute distress.  She   is awake, alert, oriented x3.  She is pleasant  and cooperative with exam.   EXTREMITIES:  Examination of the right foot reveals no open fracture, but she   has edema, tenderness to palpation over the fifth metatarsal, particularly at   the distal aspect with slight ecchymosis, but is neurovascularly intact to   the toes.  No tenderness over an intact Achilles tendon.  She is able to   dorsiflex and plantar flex the foot against resistance.  She has no   tenderness to palpation over the lateral and medial malleolus or to the more   proximal tib-fib:  Here in the department, x-ray of the right foot reveals a   comminuted distal fifth metatarsal fracture.     EMERGENCY DEPARTMENT COURSE:  Here, the patient is given Percocet and Motrin   followed by Dilaudid IM.  Her husband actually brought in her own right foot   boot and crutches so she just puts that on here in our department.  She is   given copies of her x-rays.  Dr. Charlton Amor agrees with the following   assessment and plan.     ASSESSMENT:     1. Right foot fracture.     PLAN:     1. The patient will be discharged home to follow up with Dr. Fara Boros.   2. Continue her boot and crutches without weightbearing until seen.   3. Over-the-counter Motrin with meals.   4. Percocet, dispense 30.   5. Ice and elevate when possible.     DISPOSITION:  Home in stable condition.       *-*-*                                             _______________________________________   SB/jm                                  _____   D:  01/22/2010 22:01                  Georgiann Mccoy, PA-C   T:  01/23/2010 11:02   Job #:  [id number]                                         _______________________________________                                          _____                                         Phylliss Blakes, M.D.   c:   Waymon Budge, D.P.M.                                EMERGENCY  DEPARTMENT NOTE  PAGE    1 of   1                                          _____                                         Phylliss Blakes, M.D.   c:   Waymon Budge, D.P.M.                                EMERGENCY DEPARTMENT NOTE                                                                PAGE    1 of   1

## 2010-01-23 NOTE — Unmapped (Signed)
[facility]     PATIENT NAME:   MASAYO, FERA                  MR #:  [id number]   DATE OF BIRTH:  08-Dec-1970                        ACCOUNT #:  [id number]   ED PHYSICIAN:   Phylliss Blakes, M.D.            ROOM #:   PRIMARY:        Knute Neu. Duanne Limerick, M.D.            NURSING UNIT:  ED   REFERRING:                                        FC:  C   DICTATED BY:    Phylliss Blakes, M.D.            ADMIT DATE:  01/22/2010   VISIT DATE:     01/22/2010                        DISCHARGE DATE:                               EMERGENCY DEPARTMENT NOTE     *-*-*     ADDENDUM, 01/22/2010, lga     CHIEF COMPLAINT:  Right foot, possible fracture.     HISTORY OF PRESENT ILLNESS:  This is a 39 year old female with foot pain, who   broke her foot, seen and evaluated by Georgiann Mccoy.  I agree with the above   dictated history and physical, contributed to the medical decision making.  I   reviewed her foot x-ray.  She is being discharged home.     DIAGNOSIS:     1.  Foot fracture.       *-*-*                                             _______________________________________   MR/lm                                  _____   D:  01/22/2010 20:01                  Phylliss Blakes, M.D.   T:  01/23/2010 09:33   Job #:  [id number]                                    EMERGENCY DEPARTMENT NOTE                                                                PAGE    1 of   1

## 2010-02-01 NOTE — Progress Notes (Signed)
Weight called to schedulers

## 2010-02-01 NOTE — Progress Notes (Signed)
Identify the learner who is being assessed for education:      Patient     Ability to Learn:  Exhibits ability to grasp concepts and respond to questions:  High       Ready to Learn: Kathryn Goodman         Preferred Method of Learning:  verbal       Barriers to Learning: Verbalizes interest       Special Considerations due to cultural, religious, spiritual beliefs: Yes     Language:  English     Language Interpreter: Yes                   [facility] Perioperative Care Plan                Pre Operative Care   Pain scale and pain management    Patient verbalizes understanding of pain scale and pain management     Pre-operative determination of patient???s anticipated Post-Operative pain goal:           4 of 10 on 10 point scale post op goal       Other      Medication(s), Pre op    Patient verbalizes understanding of preop medications (see [facility]       Presurgical Instructions)                   Instructions, Pre op    Patient verbalizes understanding of presurgical instructions as reviewed with phone interview nurse    Appropriate evaluation / integration of data as delineated by ASPAN Standards of Perianesthesia Nursing Practice     Instructions, Discharge Planning    Patient / family voices understanding of home care and follow up procedures   Encourage patient / significant other to review discharge instructions the day after           procedure due to sedation on day of surgery          Special Needs           Cooling device           Crutches           [name]           Wound Support device           Drain            Other                 Fall Risk Potential, Preoperatively               No preoperative risk identified   Preop risk identified     Sensory deficit          Motor deficit          Balance problem          Home medication         Uses assistive device  Goal(s) for fall prevention:       Prevent fall or injury by use of  encouraging call light for assistance, side rails, and assisting with activity on day of surgery                Patient / Significant other verbalizes understanding the need to call for assistance prior to getting out of bed during hospitalization                Infection Precautions    Patient understands implementation of  infection precautions (see [facility] Pres[facility] Instructions)                             Patient Safety                []  Patient identification                []  Site verification      02/01/2010 2:21 PM [name]

## 2010-02-01 NOTE — Patient Instructions (Signed)
[facility] PRE-SURGICAL TESTING INSTRUCTIONS  Date of Procedure 7-21 Time of Procedure 1200    PRIOR TO PROCEDURE DATE:  1. Arrange for someone to take you home and be with you after discharge since you cannot drive after receiving sedation. Please ensure it is someone we can share information with regarding your discharge.    2. Contact your doctor for advice if:       a. You are taking any blood thinners, aspirin, anti-inflammatory or vitamin E products.       b. There is a change in your physical state such as a cold, fever, rash, cuts, sores or infection, especially near your surgical site.    3. Do not drink alcohol the day before your surgery.    4. Please follow guidelines prior to surgery for diet, medication, or preparations as advised by your doctor.    5. FOLLOW INSTRUCTIONS FOR ARRIVAL TIME AS DIRECTED BY YOUR DOCTOR.  If your doctor does not give you a specific arrival time, please arrive at 1000    THE DAY OF YOUR PROCEDURE:  1. Please report to registration desk at the WEST entrance on the day of surgery. Bring your photo ID and your insurance card. Please call [phone] if you have not pre registered yet.     2. DO NOT EAT OR DRINK ANYTHING AFTER MIDNIGHT. The only exception would be a small sip of water with any medications you were told to take the morning of surgery.        a. Medication instructions for the day of surgery: morning meds with a sip of water, folloe diabetic meds as per Dr. Duanne LimerickSamaan.       b. Please contact your Diabetic Physician to receive instructions regarding your insulin for the night before and the day of surgery.Follow advise per PCP.    3. Do not swallow water when brushing teeth. No gum, candy, mints or ice chips. Refrain from smoking or at least decrease the amount.    4. Dress in loose, comfortable clothing appropriate for redressing after your procedure.  Do not wear jewelry, make-up, fingernail polish, lotion, powders or metal hairclips.    5. Dentures and  glasses may need to be removed before surgery. Bring a case for your glasses or contacts.  If you use a CPAP, please bring it with you the day of surgery.    6. Leave any valuables at home such as credit cards, cash, cell phones and jewelry. The hospital will not be responsible for valuables that are not secured in the hospital safe.    7. You may bring a bag with personal items if you are to spend the night. Please have any large items you may need brought in by your family after your arrival to your hospital room.    8. Please bring a copy of any history and physical form, or medical reports your doctor may have given you.    9. If you have a Living Will or Durable Power of Attorney, please bring a copy on the day of your procedure.     HOW WE KEEP YOU SAFE and WORK TO PREVENT SURGICAL SITE INFECTIONS:  1. Health care workers should always check your ID bracelet to verify your name and birth date. You will be asked many times to state your name, date of birth, and allergies.    2. Health care workers should always clean their hands with soap or alcohol gel before providing care to you. It  is okay to ask anyone if they cleaned their hands before they touch you.    3. You will be actively involved in verifying the type of surgery you are having and ensuring the correct surgical site is confi rmed.    4. Do not shave near where you will have surgery. Shaving with a razor can irritate your skin and make it easier to develop an infection. On the day of your procedure, any hair that needs to be removed near the surgical site will be ???clipped??? by a Research scientist (physical sciences) using a special clippers designed to avoid skin irritation.    5. When you are in the operating room, your surgical site will be cleansed with a special soap and in most cases you will be given an antibiotic before the surgery begins.    AFTER YOUR PROCEDURE:  1. For comfort and safety, arrange to have someone at home with you for the first 24 hours after  discharge.    2. You and your family will be given written instructions about your diet, activity, dressing care, medications, and return visits.    3. Always clean your hands before and after caring for your wound.    4. Mild nausea, headache, muscle aches, sore throat, or fatigue may occur after anesthesia. Should any of these symptoms become severe, or should you notice any signs of infection, you should call your doctor.    SPECIAL INSTRUCTIONS         ADDITIONAL INFORMATION REVIEWED:  Yes Bring a urine sample on day of surgery  Yes Taking Control of Your Pain  No FAQs about ???Surgical Site Infections  No Hibiclens?? Bathing Instructions   No Other    For further instructions or questions, please call [phone]    02/01/2010 .1:56 PM .Shaune Pascal      Information reviewed with patient during phone interview.

## 2010-02-02 LAB — CBC WITH AUTO DIFFERENTIAL
Basophils %: 0.3 % (ref 0.0–2.0)
Basophils Absolute: 0 10*3 (ref 0.0–0.2)
Eosinophils %: 1.7 % (ref 0.0–5.0)
Eosinophils Absolute: 0.1 10*3 (ref 0.0–0.6)
Granulocyte Absolute Count: 4.2 10*3 (ref 1.7–7.7)
Hematocrit: 41.4 % (ref 36.0–48.0)
Hemoglobin: 13.7 gm/dl (ref 12.0–16.0)
Lymphocytes %: 29 % (ref 25.0–40.0)
Lymphocytes Absolute: 2 10*3 (ref 1.0–5.1)
MCH: 28.3 pg (ref 26–34)
MCHC: 33.1 gm/dl (ref 31–36)
MCV: 85.6 fl (ref 80–100)
MPV: 10.4 fl (ref 5.0–10.5)
Monocytes %: 7 % (ref 0.0–12.0)
Monocytes Absolute: 0.5 10*3 (ref 0.0–1.3)
Platelets: 221 10*3 (ref 135–450)
RBC: 4.84 10*6 (ref 4.0–5.2)
RDW: 15.8 % — ABNORMAL HIGH (ref 11.5–14.5)
Segs Relative: 62 % (ref 42.0–63.0)
WBC: 6.8 10*3 (ref 4.0–11.0)

## 2010-02-02 LAB — LIPID PANEL
Cholesterol, Total: 188 mg/dl (ref <200)
HDL: 39 mg/dl — ABNORMAL LOW (ref 40–60)
LDL Calculated: 124 mg/dl — ABNORMAL HIGH (ref <100)
Triglycerides: 126 mg/dl (ref <150)
VLDL Cholesterol Calculated: 25 mg/dl

## 2010-02-02 LAB — COMPREHENSIVE METABOLIC PANEL
ALT: 40 U/L (ref 10–40)
AST: 28 U/L (ref 15–37)
Albumin/Globulin Ratio: 1.7 (ref 1.1–2.2)
Albumin: 4.7 gm/dl (ref 3.4–5.0)
Alkaline Phosphatase: 126 U/L (ref 45–129)
BUN: 24 mg/dl — ABNORMAL HIGH (ref 7–18)
CO2: 25 mEq/L (ref 21–32)
Calcium: 10 mg/dl (ref 8.3–10.6)
Chloride: 105 mEq/L (ref 99–110)
Creatinine: 0.7 mg/dl (ref 0.6–1.1)
GFR Est, African/Amer: >60
GFR, Estimated: >60 (ref >60)
Glucose: 202 mg/dl — ABNORMAL HIGH (ref 70–99)
Potassium: 4.7 mEq/L (ref 3.5–5.1)
Sodium: 141 mEq/L (ref 136–145)
Total Bilirubin: 0.5 mg/dl (ref 0.0–1.0)
Total Protein: 7.4 gm/dl (ref 6.4–8.2)

## 2010-02-02 LAB — T4, FREE: T4 Free: 1.22 ng/dl (ref 0.9–1.8)

## 2010-02-02 LAB — TSH
T4 Free: 1.22 ng/dl (ref 0.9–1.8)
TSH: 3.14 u[IU]/mL (ref 0.35–5.5)

## 2010-02-04 LAB — POCT GLUCOSE MONITORING DEVICE
POC Glucose: 174 mg/dL — ABNORMAL HIGH (ref 65–99)
POC Glucose: 141 mg/dL — ABNORMAL HIGH (ref 65–99)

## 2010-02-04 MED ORDER — FAMOTIDINE 10 MG/ML IV SOLN
10 MG/ML | Freq: Once | INTRAVENOUS | Status: AC
Start: 2010-02-04 — End: 2010-02-04
  Administered 2010-02-04: 16:00:00 20 mg via INTRAVENOUS

## 2010-02-04 MED ORDER — MIDAZOLAM HCL 2 MG/2ML IJ SOLN
2 MG/ML | Freq: Once | INTRAMUSCULAR | Status: AC
Start: 2010-02-04 — End: 2010-02-04
  Administered 2010-02-04: 16:00:00 2 mg via INTRAVENOUS

## 2010-02-04 MED ORDER — LACTATED RINGERS IV SOLN
INTRAVENOUS | Status: DC
Start: 2010-02-04 — End: 2010-02-05
  Administered 2010-02-04: 15:00:00 via INTRAVENOUS

## 2010-02-04 MED ORDER — FENTANYL CITRATE 0.05 MG/ML IJ SOLN
0.05 MG/ML | INTRAMUSCULAR | Status: DC | PRN
Start: 2010-02-04 — End: 2010-02-05
  Administered 2010-02-04 (×2): 25 ug via INTRAVENOUS

## 2010-02-04 MED ORDER — MORPHINE SULFATE 2 MG/ML IJ SOLN
2 MG/ML | INTRAMUSCULAR | Status: DC | PRN
Start: 2010-02-04 — End: 2010-02-05
  Administered 2010-02-04: 21:00:00 via INTRAVENOUS

## 2010-02-04 MED ADMIN — ondansetron (ZOFRAN) injection 4 mg: INTRAVENOUS | @ 21:00:00 | NDC 10019090517

## 2010-02-04 MED ADMIN — promethazine (PHENERGAN) 25 MG/ML injection: INTRAVENOUS | @ 22:00:00 | NDC 00641092821

## 2010-02-04 MED ADMIN — clindamycin (CLEOCIN) 600mg IVPB 100ml add-vantage NS: 600 mg | INTRAVENOUS | @ 17:00:00 | NDC 00409710167

## 2010-02-04 MED ADMIN — morphine 10 MG/ML injection: @ 20:00:00 | NDC 00409126130

## 2010-02-04 MED ADMIN — oxycodone-acetaminophen (PERCOCET) 5-325 MG per tablet 2 tablet: 1 | ORAL | @ 22:00:00 | NDC 63481062301

## 2010-02-04 MED FILL — POVIDONE-IODINE 10 % EX OINT: 10 % | CUTANEOUS | Qty: 30

## 2010-02-04 MED FILL — MIDAZOLAM HCL 2 MG/2ML IJ SOLN: 2 MG/ML | INTRAMUSCULAR | Qty: 4

## 2010-02-04 MED FILL — DEXAMETHASONE SODIUM PHOSPHATE 4 MG/ML IJ SOLN: 4 MG/ML | INTRAMUSCULAR | Qty: 1

## 2010-02-04 MED FILL — PROMETHAZINE HCL 25 MG/ML IJ SOLN: 25 MG/ML | INTRAMUSCULAR | Qty: 1

## 2010-02-04 MED FILL — FENTANYL CITRATE 0.05 MG/ML IJ SOLN: 0.05 MG/ML | INTRAMUSCULAR | Qty: 4

## 2010-02-04 MED FILL — DOUBLE ANTIBIOTIC 500-10000 UNIT/GM EX OINT: 500-10000 UNIT/GM | CUTANEOUS | Qty: 28.35

## 2010-02-04 MED FILL — CLINDAMYCIN PHOSPHATE 600 MG/4ML IV SOLN: 600 MG/4ML | INTRAVENOUS | Qty: 4

## 2010-02-04 MED FILL — PROPOFOL 10 MG/ML IV EMUL: 10 MG/ML | INTRAVENOUS | Qty: 60

## 2010-02-04 MED FILL — FENTANYL CITRATE 0.05 MG/ML IJ SOLN: 0.05 MG/ML | INTRAMUSCULAR | Qty: 2

## 2010-02-04 MED FILL — SENSORCAINE-MPF 0.5 % IJ SOLN: 0.5 % | INTRAMUSCULAR | Qty: 30

## 2010-02-04 MED FILL — FAMOTIDINE 10 MG/ML IV SOLN: 10 MG/ML | INTRAVENOUS | Qty: 2

## 2010-02-04 MED FILL — ONDANSETRON HCL 4 MG/2ML IJ SOLN: 4 MG/2ML | INTRAMUSCULAR | Qty: 2

## 2010-02-04 MED FILL — LIDOCAINE HCL (PF) 1 % IJ SOLN: 1 % | INTRAMUSCULAR | Qty: 30

## 2010-02-04 MED FILL — MIDAZOLAM HCL 2 MG/2ML IJ SOLN: 2 MG/ML | INTRAMUSCULAR | Qty: 2

## 2010-02-04 MED FILL — LIDOCAINE HCL 2 % IJ SOLN: 2 % | INTRAMUSCULAR | Qty: 20

## 2010-02-04 MED FILL — PERCOCET 5-325 MG PO TABS: 5-325 MG | ORAL | Qty: 1

## 2010-02-04 MED FILL — MORPHINE SULFATE 10 MG/ML IJ SOLN: 10 mg/mL | INTRAMUSCULAR | Qty: 1

## 2010-02-04 NOTE — Brief Op Note (Signed)
Brief Postoperative Note    Kathryn Goodman  Date of Birth:  Feb 20, 1971  [id number]    Surgeon: Waymon Budge DPM    Assistants: Annabell Howells PGY3    Pre-operative Diagnosis: right 5th metatarsal fracture    Post-operative Diagnosis: Same    Procedure: ORIF right 5th metatarsal fracture    Anesthesia: general    Hemostasis:  Pneumatic ankle tourniquet    Estimated Blood Loss: less than 100     Materials: 4-0 Vicryl, 4-0 Nylon, Orthofix mini-rail, 28 gauge monofilament wire    Injectables: 30 mL 0.5% Marcaine plain, 10 mL 2% Lidocaine plain    Complications: none    Specimens: were not obtained    Drains/Lines:  None                Clint Lipps

## 2010-02-04 NOTE — Anesthesia Post-Procedure Evaluation (Signed)
Anesthesia Post-op Note    Patient: Kathryn Goodman   DOB: 01/02/1971    MRN: [id number]   Procedure(s) Performed: ORIF RT. 5TH TOE  Date of Surgery: 02/04/10  Anesthesia type: GA      POST-OP ASSESSMENT::    Anesthetic Problems: NONE   Last Vitals: Blood pressure 140/106, pulse 72, temperature 98.1 ??F (36.7 ??C), temperature source Oral, resp. rate 20, height 5\' 7"  (1.702 m), weight 295 lb (133.811 kg), SpO2 95.00%, not currently breastfeeding.    Cardiovascular System Stable: YES  Respiratory Function: Airway Patent: YES  Ventilator and/or ETT: NO  Hydration Adequate: YES  Nausea: NO  Level of consciousness: awake, alert  and oriented  Post-op pain control: YES    Other:

## 2010-02-04 NOTE — Progress Notes (Signed)
Pt cont to be nauseas, Dr Rama updated.  Phenergan IV given

## 2010-02-04 NOTE — Progress Notes (Signed)
Clindamycin 600 mg sent to OR

## 2010-02-04 NOTE — Discharge Instructions (Addendum)
PODIATRIC SURGERY POST-OP INSTRUCTIONS    1.  Non weight bearing on right foot in posterior splint.  2.  Keep dressing on right leg/foot clean, dry, intact.  3.  Take medications as directed.  4.  Apply ice behind your right knee 20 minutes per hour.  5.  Elevate your right foot/leg at heart level or above at all times while sitting or lying down.  6.  Call Dr. Jamie Kato office for follow-up appointment within 3-5 days; call [phone].  7.  Call Dr. Fara Boros with any questions or concerns.    [facility] AMBULATORY PROCEDURE DISCHARGE INSTRUCTIONS  You may be drowsy or lightheaded after receiving sedation or anesthesia.    A responsible person should be with you for the next 24 hours.    Please follow the instructions checked below:    DIET INSTRUCTIONS:  [x] Start with light diet and progress to your normal diet as you feel like eating. If you experience nausea or repeated episodes of vomiting which persist beyond 12-24 hours, notify your doctor.  [] Other     ACTIVITY INSTRUCTIONS:  [x] Rest today. Increase activity as tolerated    [x] Elevate operative limb   [] Sling to operative limb  [x] No heavy lifting or strenuous activity     [x] No driving[] Use crutches  [] Use Haber   [x] Weight bearing: [x] none /  [] partial / [] full as tolerated   [x] Other -Ice behind Right knee    WOUND/DRESSING INSTRUCTIONS:     Always clean your hands before and after caring for the wound.  [] May shower      [] May bathe      [x] Keep dressing dry            [x] Do not remove dressing   [] Remove dressing on [] Leave steri strips in place       [] Derma bond dressing-Do not apply lotion, gel, or liquid to wound while the derma bond is in place.       [] Drain care   [x]  Ice to operative site for 15-30 minutes of each hour while awake for 24-36 hours  [] Use ice cooling system as instructed                  [] Post-op Shoe-wear when up and about                     [] Wear bra continuously for 24-36 hours                            [] Other    MEDICATION  INSTRUCTIONS:  [x] Resume medications as listed:   Prior to Admission medications    Medication Sig Start Date End Date Taking? Authorizing Provider   promethazine (PHENERGAN) 25 MG tablet Take 1 tablet by mouth every 6 hours as needed for Nausea. 02/04/10 02/04/11 Yes Clint Lipps, DPM   sitagliptan-metformin (JANUMET) 50-1000 MG per tablet Take 1 tablet by mouth 2 times daily (with meals).     Yes Historical Provider, MD   fexofenadine (ALLEGRA) 60 MG tablet Take 60 mg by mouth as needed.     Yes Historical Provider, MD   fluoxetine (PROZAC) 20 MG capsule Take 20 mg by mouth daily.      Historical Provider, MD   simvastatin (ZOCOR) 40 MG tablet Take 40 mg by mouth nightly.      Historical Provider, MD   oxycodone-acetaminophen (PERCOCET) 5-325 MG per tablet Take 1 tablet by mouth every 6 hours as  needed.      Historical Provider, MD   celecoxib (CELEBREX) 200 MG capsule Take 200 mg by mouth daily.      Historical Provider, MD   LEVOTHYROXINE SODIUM Take 75 mcg by mouth daily.      Historical Provider, MD     [] Prescriptions sent with you.  Use as directed.  When taking pain medications, you may experience dizziness or drowsiness.  Do not drink alcohol or drive when taking these medications.  [] You may take a non-prescription "headache remedy", preferably one that does not contain aspirin.  [x] Give the list of your medications to your primary care physician on your next visit. Keep your med  list updated and carry it with in case of emergencies.    FOLLOW-UP CARE:  [x] Call the office [phone] follow-up appointment in 3-7day(s).  Watch for these significant complications.  Call physician if they or any other problems occur:    Fever over 101      Redness, swelling, hardness or warmth at the operative site    Unrelieved nausea      Foul smelling drainage at the operative site     Unrelieved pain      Blood soaked dressing. (Some oozing may be normal)    Inability to urinate        Numb, pale, blue, cold or  tingling extremity      Physician Dr Fara Boros Phone [phone]    The above instructions were reviewed with patient/significant other.  The following additional patient specific information was reviewed with the patient/significant other:  [] Procedure/physician specific instructions  [] Cataract surgery   [] Lap Cholecystectomy  [] Medication information sheet(s)    [] Dionne's egress test  [] Catheter associated blood stream infections   [] Breast biopsy    [] Hernia repair   [] Catheter/leg bag instructions    [] JP drain   [] Other-  I have read and understand the instructions given to me: ____________________________________________   (Patient/S.O. Signature)           Nurse Glenis Smoker ,R.N.   Date/time 02/04/2010 4:25 PM         PACU:  [phone]      SAME DAY SERVICES:  [phone]        If you smoke STOP. We care about your health!

## 2010-02-04 NOTE — Anesthesia Pre-Procedure Evaluation (Signed)
Marbeth A Candelaria     Anesthesia Evaluation     Patient summary reviewed and Nursing notes reviewed    Airway   Mallampati: II  TM distance: >3 FB  Neck ROM: full  Dental      Pulmonary - negative ROS and normal exam   Cardiovascular - normal exam  (+) hypertension,     Neuro/Psych - negative ROS   GI/Hepatic/Renal    (+) GERD,     Endo/Other    (+) poorly controlled, hypothyroidism  Abdominal   (+) obese,               Anesthesia Plan    ASA 3     general     intravenous induction   Anesthetic plan and risks discussed with patient.        Lenon Curt  02/04/2010

## 2010-02-04 NOTE — Progress Notes (Addendum)
accu check 174. Dr. Jory EeSachar aware.

## 2010-02-04 NOTE — Progress Notes (Signed)
Glasses to husband

## 2010-02-04 NOTE — Progress Notes (Signed)
C/O of nausea. Zofran IV given.

## 2010-02-04 NOTE — Anesthesia Post-Procedure Evaluation (Signed)
Anesthesia Post-op Note    Patient: Kathryn Goodman    Procedure(s) Performed: ORIF 5th metatarsal    Anesthesia type: MAC    Patient location: PACU    Post-op pain: Adequate analgesia    Post-op assessment: no apparent anesthetic complications    Last Vitals:   Filed Vitals:    02/04/10 1615   BP: 132/94   Pulse: 78   Temp:    Resp: 16     Post-op vital signs: stable    Level of consciousness: awake    Complications: none    Marycruz Boehner  4:33 PM

## 2010-02-05 MED FILL — PROPOFOL 10 MG/ML IV EMUL: 10 MG/ML | INTRAVENOUS | Qty: 120

## 2010-02-05 MED FILL — MORPHINE SULFATE 10 MG/ML IJ SOLN: 10 MG/ML | INTRAMUSCULAR | Qty: 1

## 2010-02-15 NOTE — Op Note (Signed)
Operative Note:    Kathryn Goodman  [id number]  02/15/2010    Surgeon: Waymon Budge, DPM  Assistant: Clint Lipps, DPM  Pre-operative Diagnosis: Fracture right 5th metatarsal   Post-operative Diagnosis: Same  Procedure: ORIF right 5th metatarsal fracture and application of external fixation device  Anesthesia: General LMA anesthesia  Hemostasis: pneumatic ankle tourniquet  EBL: Minimal  Materials: Suture: 3-0 Vicryl, 4-0 Vicryl, 4-0 Nylon, 28 gauge monofilament wire, Orthofix mini-rail   Injectables: 0.5% Bupivacaine and 2% Lidocaine plain 50/50 20 cc  Lines/Drains: none  Complications: None  Pathologic Specimens: none    Indications for procedures: After undergoing trauma to her right foot and discussing the options of surgical vs non-surgical treatment, the patient wished to consider surgical intervention. All potential risks, benefits, complications and alternatives to the surgery were discussed. No guarantees were given. The surgical procedures were discussed in detail and the patient wished to proceed with surgical intervention. Proper informed consent was obtained.     Details of procedure: Open reduction and internal fixation right 5th metatarsal   The patient was brought into the operating room and placed on the operating room table in the supine position. After induction of anesthesia, a pneumatic ankle tourniquet was placed about the patient's well-padded right ankle. The patient's right lower extremity was the scrubbed, prepped and draped in the usual sterile fashion. Esmarch bandage was utilized to exsanguinate the patient's right lower extremity and ankle tourniquet inflated to 250 mmHg.   Prior to skin incision, c-arm fluoroscopy was used to confirm fracture of the right 5th metatarsal. Complete fracture at the 5th metatarsal neck/metaphysis was noted.       APPLICATION OF UNIPLANE EXTERNAL FIXATION DEVICE   A distal pin from the Orthofix mini-rail was placed in the 5th metatarsal head and confirmed by  fluoroscopy to not be crossing the fracture site or invading the MPJ. Proximal pins were then inserted proximal to the fracture site. An additional distal pin was then inserted across the base of the 5th toe proximal phalanx. Distraction forces were then applied via external fixator in order to achieve reduction of fracture fragments.  A k-wire was also placed within the 5th metatarsal head and utilized as a lever arm for fracture fragment reduction.  After attempting reduction of fracture fragments in order to regain length and improve alignment, however, reduction of fracture fragments in proper position was not maintained.  It was determined at this time that additional internal fixation would be required and that external fixation would be used for additional stabilization.    Attention was directed to the lateral aspect of the patient's right foot, where an approximate 5 cm linear longitudinal incision was made from the base of the 5th proximal phalanx to the mid-shaft of the 5th metatarsal. The incision was made through the skin initially with a #15 blade. Dissection continued through the subcutaneous layer with a combination of sharp and blunt dissection until the periosteum was encountered. A periosteal linear longitudinal incision was then created along the line of the original skin incision and the periosteum reflected sharply off the underlying bone, revealing the fractured fifth metatarsal. A curette was obtained and utilized to remove hematoma from the fracture site. The fracture site was then flushed with normal sterile saline and a freer elevator used to mobilize the distal fracture fragment. Once the distal fragment was mobilized, it was aligned appropriately with the shaft of the fifth metatarsal and a k-wire used for temporary fixation. Screw fixation was attempted but  not successful due to loss of bone stock within the fracture site and possible additional splintering at the fracture site.  The  k-wire was then removed and 28 gauge monofilament wire utilized in a cerclage technique circumferentially around the fracture site.  Reduction of fracture fragments was noted upon light compression of the monofilament wire via twisting of the wire.  The wire was then cut and bent toward the bone to avoid soft tissue irritation.  Proper fixation and successful reduction of fracture fragments was then noted radiographically.  The external fixator was locked into position and attention directed towards wound closure after lavage with sterile saline.  The periosteal layer was closed with 3-0 Vicryl in a simple interrupted suture technique and the subcutaneous layer was closed with 4-0 Vicryl in a continuous running suture technique. The skin was coapted with 4-0 Nylon in a simple interrupted fashion. Pin sites and incisions were addressed with adaptic, gauze, kerlix. The pneumatic ankle tourniquet was deflated and brisk capillary refill noted to the right foot. A lightly compressive dressing and posterior splint was applied with cast padding and ACE wrap.     The patient tolerated the procedure and anesthesia well. She was transferred from the operating room to the PACU with vital signs stable and neurovascular status at her baseline to her right foot. After a period of post-operative monitoring, the patient will be discharged home with detailed written and verbal instructions for care of her right foot per Dr. Fara Boros. The patient is to contact Dr. Fara Boros with any questions or concerns.

## 2010-03-04 NOTE — Unmapped (Signed)
[facility]     PATIENT NAME:   Tamara Cox, Tamara Cox                  MR #:  [id number]   DATE OF BIRTH:  1971-01-27                        ACCOUNT #:  [id number]   ED PHYSICIAN:   Aura Fey, M.D.             ROOM #:  203   PRIMARY:        Knute Neu. Duanne Limerick, M.D.            NURSING UNIT:  [facility]2PT   REFERRING:                                        FC:  C   DICTATED BY:    Aura Fey, M.D.             ADMIT DATE:  03/04/2010   VISIT DATE:     03/04/2010                        DISCHARGE DATE:                               EMERGENCY DEPARTMENT NOTE     *-*-*     *** ADDENDUM - 03/04/2010 - TMR ***     This patient was seen along with the PA, [place] Moser.  Please refer to his   dictation for full history, physical exam, assessment and plan.           *-*-*                                             _______________________________________   MV/tmr                                 _____   D:  03/04/2010 13:19                  Aura Fey, M.D.   T:  03/04/2010 22:29   Job #:  [id number]                                    EMERGENCY DEPARTMENT NOTE                                                                PAGE    1 of   1  PAGE    1 of   1

## 2010-03-04 NOTE — Unmapped (Signed)
[facility]     PATIENT NAME:   Tamara Cox, Tamara Cox                  MR #:  [id number]   DATE OF BIRTH:  10-30-1970                        ACCOUNT #:  [id number]   ADMITTING:      Durwin Nora, M.D.               ROOM #:  203   ATTENDING:      Durwin Nora, M.D.               NURSING UNIT:  M2PT   CONSULTANT:     Maisie Fus, M.D.                 [facility]:  C   SERVICE:        Pulmonary Medicine                ADMIT DATE:  03/04/2010   PRIMARY:        Knute Neu. Duanne Limerick, M.D.            CONSULT DATE:   REFERRING:                                        DISCHARGE DATE:   DICTATED BY:    Maisie Fus, M.D.                                 PULMONARY CONSULTATION     *-*-*     REQUESTING PHYSICIAN:  Dr. Norman Herrlich.     REASON FOR CONSULTATION:  Massive pulmonary embolism.     HISTORY OF PRESENT ILLNESS:  A 39 year old white American female presented   with rapidly progressive shortness of breath for the past 24 hours.  She had   associated chest pain across her lower chest anteriorly.  No hemoptysis.  No   palpitation, no syncope.  Had right foot surgery on 02/04/2010 after which her   activity has been limited with no weightbearing on her right foot.  Has not   been on DVT prophylaxis.     CAT scan of the chest with IV contrast in the emergency room showed bilateral   pulmonary embolism with saddle emboli.     The patient hemodynamically stable with no hypertension or significant   hypoxemia.  No prior history of DVT or PE.  Her father had a DVT.  No history   of malignancy or recent long travel.  The patient admitted to the intensive   care unit for further evaluation.     REVIEW OF SYSTEMS:  CONSTITUTIONAL:  She has no fever, no chills.  EYES:  No   dryness or redness.  ENT:  No sore throat.  RESPIRATORY:  She has shortness   of breath.  No cough, no sputum production, no hemoptysis.  CARDIOVASCULAR:   She had chest pain.  No palpitations.  GI:  No abdominal pain, no diarrhea.   GU:  No hematuria.   SKIN:  No rash.  MUSCULOSKELETAL:  She had joint pain.   NEUROLOGIC/PSYCH:  No agitation, anxiety.     PAST MEDICAL HISTORY:     1.  Diabetes.  2.  Hypothyroidism.     PAST SURGICAL HISTORY:     1.  Right foot surgery 01/2010.   2.  Abdominal hernia repair 06/2009.   3.  Right knee surgery 04/2009.     HOME MEDICATIONS:     1.  Percocet   2.  Vicodin.   3.  Benadryl.   4.  Actos.   5.  Janumet.   6.  Promethazine.   7.  Synthroid.     ALLERGIES:     1.  Codeine.   2.  Sulfa.   3.  Penicillin.   4.  Quinolone.     SOCIAL HISTORY:  Does not smoke.  Lives at home.     FAMILY HISTORY:  No family history for asthma.  Father had history of   thromboembolic disease.     PHYSICAL EXAMINATION:     VITAL SIGNS:  Temperature 98.5, heart rate 78, respirations 13, blood   pressure 126/88, saturation 100% on 3 liters nasal cannula.   GENERAL:  She is in no acute distress.  White American female, morbidly   obese.   HEENT:  Pupils midsize, reactive.  No icterus, no injection.   NECK:  Trachea is midline.   NECK:  Supple.  No neck masses or thyromegaly.   RESPIRATORY:  No wheezes, no crackles, no rhonchi.  No dullness.  No   retraction, no accessory muscle use.   HEART:  Sinus rhythm.  No gallops, no rubs, no edema, no JVD.   ABDOMEN:  Soft, benign.  No tenderness, no distention.  Positive bowel   sounds.  No abdominal wall hernia.   SKIN:  No rashes or nodules on the exposed extremities.   LYMPHATICS:  No cervical lymphadenopathy or supraclavicular lymphadenopathy.   NEUROPSYCHIATRIC:  Alert, awake, oriented x3, following commands.  No focal   deficits.   MUSCULOSKELETAL:  No cyanosis or clubbing.  No joint deformity.     DATA:     CAT scan of the chest was personally reviewed.  Showed extensive bilateral   pulmonary embolism with central saddle emboli.     White count 6.3, hemoglobin 13.6, platelet count 154, BUN 20, creatinine 0.8.     ASSESSMENT:     1.  Acute bilateral pulmonary embolism.  Likely her provoking factor is      recent surgery and limited activities since 02/04/2010.   2.  Dyspnea secondary to pulmonary embolism.   3.  Diabetes.     PLAN:     1.  Currently, patient is hemodynamically stable with no hypertension or     significant hypoxemia. No indication for thrombolytic therapy at this time.   2.  We will continue to observe carefully in the intensive care unit.  The     possibility of giving thrombolytic therapy discussed with the patient     extensively in case she develops hemodynamic or respiratory compromise.     Complications of thrombolytic therapy discussed with the patient including     bleed, which could be up to ___% chance.  Intracranial bleed also discussed     with patient, which is in up to 5% of the cases.  The patient has no     absolute or relative contraindication for the thrombolytic therapy upon     review.   3.  I prefer heparin drip over low molecular weight heparin in case the     patient requires thrombolytic therapy.   4.  Bed rest for now.  5.  Continue her home medications.     I thank you, Dr. Norman Herrlich, for allowing me to participate in this patient's   care.       *-*-*                                             _______________________________________   SA/nn                                  _____   D:  03/04/2010 15:02                   Maisie Fus, M.D.   T:  03/04/2010 19:34   Job #:  [id number]                                      PULMONARY CONSULTATION                                                                PAGE    1 of   1   SA/nn                                  _____   D:  03/04/2010 15:02                   Maisie Fus, M.D.   T:  03/04/2010 19:34   Job #:  [id number]                                      PULMONARY CONSULTATION                                                                PAGE    1 of   1

## 2010-03-04 NOTE — Unmapped (Signed)
[facility]     PATIENT NAME:   Tamara Cox, Tamara Cox                  MR #:  [id number]   DATE OF BIRTH:  24-Oct-1970                        ACCOUNT #:  [id number]   ADMITTING:      Durwin Nora, M.D.               ROOM #:  203   ATTENDING:      Durwin Nora, M.D.               NURSING UNIT:  M2PT   PRIMARY:        Knute Neu. Duanne Limerick, M.D.            Elaina HoopsSalena Saner   REFERRING:                                        ADMIT DATE:  03/04/2010   DICTATED BY:    Durwin Nora, M.D.               DISCHARGE DATE:                                  HISTORY & PHYSICAL     *-*-*     CHIEF COMPLAINT:  Increasing shortness of breath.     HISTORY OF PRESENT ILLNESS:  The patient is a 39 year old Caucasian obese   female who presented to [facility] Emergency Room complaining of   increasing shortness of breath.  The patient reports approximately four weeks   ago she sustained a right fifth metatarsal fracture when she twisted her   foot.  She was climbing into a pool.  The patient had a surgical procedure   performed with pinning of the right fifth metatarsal.  The patient states she   woke up last night from sleep with severe shortness of breath and bilateral   pleuritic chest pain with deep inspiration.  Denies any hemoptysis.  Denies   any substernal chest pain or calf tenderness.  Denies any fever or chills.     PAST MEDICAL HISTORY:     1. Type 2 diabetes.   2. Hypothyroidism.   3. Questionable history of asthma.     PAST SURGICAL HISTORY:     1. Right foot fifth metatarsal fracture.   2. Hysterectomy for excessive uterine bleeding and left salpingo-oophorectomy   3. Abdominal hernia repair in December, 2010.   4. Right knee surgery October 2010.     SOCIAL HISTORY:  The patient is married.  Denies tobacco, alcohol or illicit   drug use.     FAMILY HISTORY:  Both parents are alive and well.  Father with history of   DVT.     ALLERGIES:     1. Codeine.   2. Sulfa.   3. Penicillin.   4.  Quinolones.     CURRENT MEDICATIONS:     1. Percocet.   2. Vicodin.   3. Benadryl.   4. Actos.   5. Janumet.   6. Promethazine.   7. Levothyroxine.     REVIEW OF SYSTEMS:  Full 10 system review performed with the patient  and   detailed above, otherwise noncontributory.     PHYSICAL EXAMINATION:     VITAL SIGNS:  Her initial vital signs on presentation to the emergency room   reveal blood pressure 146/100, pulse 100, respiratory rate 18, temperature   97.9, O2 sats 98% on room air.   HEENT:  Pupils equal, reactive to light and accommodation.  Pink   conjunctivae, anicteric sclerae.  Moist oral mucosa with clear posterior   pharynx.   NECK:  Supple, no JVD, carotid bruits or thyromegaly.   HEART:  Regular rate and rhythm, S1, S2, without murmurs, gallops or rubs.   LUNGS:  Clear to auscultation bilaterally without wheezing, rhonchi or rales.   ABDOMEN:  Soft, nontender, nondistended.  No palpable or pulsatile masses.   EXTREMITIES:  Without edema, clubbing or cyanosis, no swelling or edema.   Negative Homans' sign.   SKIN:  Warm and dry without rashes.     DIAGNOSTIC STUDIES:     1. CT angiogram of chest performed in the emergency room revealed extensive     bilateral and central pulmonary emboli including the central saddle   embolus.   2. Chest x-ray obtained in the emergency room revealed no acute disease.   3. Doppler ultrasound ordered by myself of bilateral lower extremities     revealed deep vein thrombosis involving the right lower extremity confirm     to the right posterior tibial veins in the mid calf.     LABORATORY DATA:  Reveals a white count of 6.3 with an H and H of 13.6 and   39.6 respectively, platelet count 154,000, 59 segs, 28 lymphs, 8 monos, 3   eosinophils.  Sodium 144, potassium 4.6, chloride 106, bicarbonate 27, BUN of   20, creatinine 0.8, blood sugar 139, anion gap of 11, POC CK-MB less than   1.0, calcium 9.1, POC troponin less than 0.05.  Prothrombin time 13.3, INR   1.0, PTT 28.2, POC BNP  19.2.     ASSESSMENT AND PLAN:     1. Bilateral pulmonary embolism.   2. Right lower extremity deep venous thrombosis.   3. Type 2 diabetes.   4. Hypothyroidism.     PLAN:     1. We will admit the patient to the intensive care unit and begin immediate     anticoagulation after bolusing with heparin and placed on a heparin drip.   2. We will also start Coumadin therapy.   3. The lower extremity Doppler ultrasound I ordered was described above.   4. Continue supplemental oxygen, IV fluid hydration and close monitoring of     vital signs and pulse oximetry.  The patient has been seen and evaluated by     intensivist pulmonary critical care specialist.   5. Will continue to check fingerstick blood sugars before meals and at     bedtime and cover with regular insulin sliding scale.   6. Will check free T4 and TSH.       *-*-*                                             _______________________________________   SM/cm                                  _____   D:  03/04/2010 16:02                   Durwin Nora, M.D.   T:  03/04/2010 19:54   Job #:  [id number]                                       HISTORY & PHYSICAL                                                                PAGE    1 of   1   SM/cm                                  _____   D:  03/04/2010 16:02                   Durwin Nora, M.D.   T:  03/04/2010 19:54   Job #:  [id number]                                       HISTORY & PHYSICAL                                                                PAGE    1 of   1

## 2010-03-04 NOTE — Unmapped (Signed)
[facility]     PATIENT NAME:   Tamara Cox, Tamara Cox                  MR #:  [id number]   DATE OF BIRTH:  03/26/1971                        ACCOUNT #:  [id number]   ED PHYSICIAN:   Aura Fey, M.D.             ROOM #:   PRIMARY:        Knute Neu. Duanne Limerick, M.D.            NURSING UNIT:  ED   REFERRING:                                        FC:  C   DICTATED BY:    Italy Harmony Sandell, P.A.                  ADMIT DATE:  03/04/2010   VISIT DATE:     03/04/2010                        DISCHARGE DATE:                               EMERGENCY DEPARTMENT NOTE     *-*-*     CHIEF COMPLAINT:  Shortness of breath that started last night.     HISTORY OF PRESENT ILLNESS:  This is a 39 year old female who had foot   surgery three weeks ago to replace a fractured right fifth metatarsal by Dr.   Fara Boros.  She states that last night she woke up and had difficulty catching her   breath and has been short of breath ever since then.  She saw Dr. Fara Boros earlier   this morning; he wanted her to come in for evaluation.  She states that she   currently has no pain, but does have pleuritic chest pain when breathing at   the bilateral bases.  She denies any cough or fevers.  She denies any chest   pain at rest.  She denies any leg swelling.  She denies any other abdominal   pain, nausea, vomiting, diarrhea or any other symptoms at this time.     PAST MEDICAL HISTORY:     1.  Diabetes.   2.  Hypothyroidism.     PAST SURGICAL HISTORY:     1.  Hysterectomy.   2.  Hernia repair.   3.  Recent right foot surgery.     MEDICATIONS:     1.  Percocet.   2.  Vicodin.   3.  Benadryl.   4.  Actos.   5.  Janumet.   6.  Promethazine.   7.  Synthroid.     ALLERGIES:     1.  Codeine.   2.  Sulfa.   3.  Penicillin.   4.  Quinolones.     SOCIAL HISTORY:  Negative for tobacco, alcohol or drugs.     FAMILY HISTORY:  Father had a DVT, unknown cause.     REVIEW OF SYSTEMS:  As above, otherwise negative per patient.     PHYSICAL EXAMINATION:     VITAL SIGNS:  Upon arrival  to the ED, blood pressure 146/100, pulse 100,   respirations 18, temperature 97.9 orally, O2 sat 98% on room air.   GENERAL APPEARANCE:  Well-developed, well nourished, no acute distress.   SKIN:  Warm and dry.  No rash.   HEENT:  Normocephalic, atraumatic.  Pupils equal, round and reactive to   light.  Extraocular movements intact.  Conjunctivae clear.  Good dentition.   Oropharynx clear.   NECK:  Supple.  No masses.  Carotids 2+ bilaterally without bruit.  No   cervical or submandibular adenopathy.  Thyroid without nodules.   LUNGS:  Breathing comfortably.  No rales, rhonchi or wheezes.   HEART:  Regular rate and rhythm.  Normal S1 and S2.  No murmur, gallop or   rub.   ABDOMEN:  No hepatosplenomegaly.  No masses or tenderness.  No aortic/renal   bruit.   BACK:  No CVA tenderness.   EXTREMITIES:  No edema.  No cyanosis.  Dorsalis pedis pulses 2+.   NEUROLOGIC:  Cranial nerves II through XII intact.  Sensory intact to light   touch.  Motor grossly intact.   PSYCHIATRIC:  Oriented x 3.  Affect normal.     EMERGENCY DEPARTMENT COURSE/IMPRESSION:  The patient was seen and examined by   myself and presented to Dr. Foye Deer, who also saw the patient.  IV access was   obtained.  She was given a liter of normal saline and 4 of morphine for pain.     LABORATORY DATA:  CBC showed a white count of 6.3, normal H&H.  Renal panel   showed a sodium and potassium 144 and 4.6, BUN and creatinine of 21 and 0.8,   one set of negative cardiac markers.  BNP of 19.2.     EKG:  Indication pleuritic chest pain.  Analysis:  Normal sinus rhythm.   Cannot rule out inferior infarct, age undetermined.  Ventricular rate of 88,   PR interval 150, QRS duration 96, QT 370, QTC 447, PRT axes of 36, -10 and 32   respectively.  No evidence for acute cardiac ischemia.     IMAGING STUDIES:  CTPA showed a saddle embolus with bilateral base PEs.     At this point, she will be admitted to the hospital.  She was started on a    heparin drip, standard dose protocol for PE.  I will place a call out to Dr.   Fara Boros, who sent her in for evaluation, and I will admit her to the hospitalist   at this time.  She has stable vitals and is in stable condition here in the   ED.     DIAGNOSIS:     1.  Bilateral pulmonary emboli with a saddle embolus.     PLAN AND DISPOSITION:     1.  Admit to the hospital.  The patient is in stable condition.     The patient was seen and examined by myself and presented to Dr. Foye Deer who   saw the patient and agrees with my assessment and plan.         *-*-*                                             _______________________________________   CM/dd  _____   D:  03/04/2010 11:38                  [place] Islam Eichinger, P.A.   T:  03/04/2010 12:10   Job #:  [id number]                                         _______________________________________                                          _____                                         Aura Fey, M.D.                                  EMERGENCY DEPARTMENT NOTE                                                                PAGE    1 of   1                                                                PAGE    1 of   1

## 2010-03-08 NOTE — Unmapped (Signed)
[facility]     PATIENT NAME:   Tamara Cox, Tamara Cox                  MR #:  [id number]   DATE OF BIRTH:  25-Jun-1971                        ACCOUNT #:  [id number]   ADMITTING:      Durwin Nora, M.D.               ROOM #:  113   ATTENDING:      Durwin Nora, M.D.               NURSING UNIT:  M1PT   SERVICE:        Internal Medicine                 FC:  C   PRIMARY:        Knute Neu. Duanne Limerick, M.D.            ADMIT DATE:  03/04/2010   REFERRING:                                        DISCHARGE DATE:   DICTATED BY:    Durwin Nora, M.D.                                 STAT DISCHARGE SUMMARY     *-*-*     DISCHARGE DIAGNOSES:     1. Acute hypoxic respiratory failure.   2. Massive bilateral pulmonary embolisms.   3. Type 2 diabetes.   4. Hypothyroidism.   5. Recent right foot surgery on 01/2010.   6. Abdominal hernia repair in December 2010.   7. Right knee surgery in October 2010.   8. Right lower extremity deep venous thrombosis.     CONSULTATIONS:     1. Dr. Maisie Fus, Pulmonary Critical Care.     PROCEDURES PERFORMED:     1. CT angiogram of chest on day of admission revealed extensive bilateral and     central pulmonary emboli including a central saddle embolus.   2. Chest x-ray on day of admission revealed no acute disease.   3. Bilateral lower extremity Doppler ultrasound, revealing a deep vein     thrombosis involving the right lower extremity.  This is confirmed to the     right posterior tibial veins in the mid calf.     BRIEF HOSPITAL COURSE:  The patient is a very pleasant 39 year old Caucasian   female who presented to [facility] after awaking in the middle of   night with complaints of acute onset of shortness of breath.  She was brought   into the emergency room where she was evaluated and found to have massive   bilateral pulmonary embolisms and was admitted to the intensive care unit   where she was anticoagulated.  The patient was seen and evaluated by   pulmonary and critical care who agreed with the treatment plan, but since the   patient was not hypotensive or profoundly hypoxemic, thrombolytic therapy was   not recommended.  The patient was properly anticoagulated on day of admission   and started on Coumadin therapy.  Supplemental oxygen was administered and   the patient  remained hemodynamically stable throughout hospitalization.   Otherwise, her hospital course was uneventful and the patient was discharged   home in hemodynamically stable condition.  The patient to have close   follow-up with her primary care physician, in 48-72 hours, to reassess and   monitor PT INR.     DISCHARGE MEDICATIONS:     1. Percocet 5/325 orally every four to six hours as needed for pain.   2. Vicodin 5/500 orally every four to six hours as needed for pain.   3. Benadryl 25 mg orally as needed every 6 hours.   4. Actos 30 mg orally daily.   5. Janumet 50/1000 orally two times daily.   6. Promethazine 25 mg orally as needed every 6 hours.   7. Levothyroxine 75 mcg orally daily.   8. Coumadin 5 mg by mouth daily.     Over 30 minutes time was spent discussing treatment plan with the patient,   nursing staff, completing paperwork, filling prescriptions and dictating   discharge summary to expedite the patient's discharge from the hospital.       *-*-*                                             _______________________________________   SM/hm                                  _____   D:  03/08/2010 08:11                   Durwin Nora, M.D.   T:  03/08/2010 09:30   Job #:  [id number]     c:   Maisie Fus, M.D.                                  STAT DISCHARGE SUMMARY                                                                PAGE    1 of   1                                  STAT DISCHARGE SUMMARY                                                                PAGE    1 of   1

## 2010-07-06 ENCOUNTER — Inpatient Hospital Stay

## 2010-07-06 NOTE — Unmapped (Signed)
[facility]     PATIENT NAME:   Tamara Cox, Tamara Cox                  MR #:  [id number]   DATE OF BIRTH:  06-30-1971                        ACCOUNT #:  [id number]   ED PHYSICIAN:   Loralie Champagne, M.D.          ROOM #:   PRIMARY:        Knute Neu. Duanne Limerick, M.D.            NURSING UNIT:  ED   REFERRING:                                        FC:  C   DICTATED BY:    Loralie Champagne, M.D.          ADMIT DATE:  07/05/2010   VISIT DATE:     07/05/2010                        DISCHARGE DATE:                               EMERGENCY DEPARTMENT NOTE     *-*-*     CHIEF COMPLAINT:  Leg swelling.     HISTORY OF PRESENT ILLNESS:  This is a 39 year old Caucasian female with a   past medical history of DVT and PE a few months ago who presents to the   emergency department describing that over the course of the last two days she   has had gradually worsening edema in her bilateral lower extremities   associated with discomfort.  She describes the discomfort as a tightness   sensation.  It is worse with weightbearing and mildly improved with rest.   She notices that the pain is slightly worse on her right leg.  However, on   her right leg the pain is posterior more so and radiates from her knees down   to her foot.  In her right foot she had a fractured metacarpal which required   surgical intervention.  This was what led to be DVT in the first place and   because of her bad foot in her bad knee on that right side she cannot tell if   the pain is worse because of that or if it is related to the edema itself.   She has not had fever or chills.  She is taking all her medications as   prescribed.  She denies any period of prolonged immobility, recent travel or   recent surgery.  The patient denies any chest discomfort or shortness of   breath and says she has no symptoms at all similar to when she was diagnosed   with a PE.     The patient says that she was on her feet quite a bit over the weekend  and   despite elevating them as much as she could through the day today her   symptoms did not improve and therefore she comes in for evaluation.     REVIEW OF SYSTEMS:  Please see HPI.  No abdominal pain.  She is tolerating   oral.  No  fever.  No noticeable change in weight.  No dysuria or hematuria.   No change in bowel habits.     PAST MEDICAL HISTORY:     1.  Diabetes.   2.  History of PE and DVT, as previously described.   3.  Hypothyroidism.   4.  The patient was previously diagnosed with hypertension about three years     ago, she was taken off medication for that.     MEDICATIONS:     1.  Coumadin.   2.  Vicodin.   3.  Simvastatin.   4.  Actos.   5.  Promethazine.   6.  Levothyroxine.   7.  Prozac.   8.  Janumet.   9.  Prevacid.   10. Alprazolam.     ALLERGIES:     1.  Penicillin.   2.  Sulfa.   3.  Codeine.     SOCIAL HISTORY:  The patient is a nonsmoker.  She denies alcohol.  She lives   with her husband.     PHYSICAL EXAMINATION:     VITAL SIGNS:  Temperature 97.4, blood pressure 168/95, heart rate 70,   respiratory rate 16, O2 sat 98% on room air.   GENERAL:  This is an obese female in no acute distress, lying on the   stretcher.   HEENT:  Head is normocephalic, atraumatic.  Pupils equal, round and reactive   to light.  Extraocular muscles intact.  No scleral icterus.  Mucous membranes   are moist.  Oropharynx is clear.   NECK:  Supple with no lymphadenopathy.  No JVD.   LUNGS:  Clear to auscultation bilaterally.  No wheezes, rales or rhonchi.   Equivalent breath sounds are at all lung fields.  There is no chest wall   crepitus or tenderness.   CARDIOVASCULAR:  Regular rate and rhythm.  No murmurs, rubs or gallops.  No   palpable heaves or thrills.   ABDOMEN:  Soft, nontender, nondistended with normoactive bowel sounds.  There   is no organomegaly.  No CVA tenderness.   EXTREMITIES:  She has bilateral lower extremity edema that is nonpitting from   her toes to her knees.  No calf tenderness.  Negative  Homans' sign.  No   erythema of the lower extremities.  2+ posterior tibial and dorsalis pedis   pulses bilaterally in the lower extremities.   SKIN:  Warm and dry without evidence of rash.   NEUROLOGIC EXAMINATION:  The patient is alert and oriented x 3.  Cranial   nerves II through XII are grossly intact.  Strength is 5/5 bilaterally upper   and lower extremities.  The patient is observed to have a normal gait without   evidence of ataxia and without being wide-based.  Sensation is grossly intact   to light touch over upper and lower extremities.   PSYCHIATRIC:  The patient has normal mood and affect.     EMERGENCY DEPARTMENT COURSE:  After the patient was seen and examined an IV   was established and labs were drawn.  Her vital signs were repeated.  Blood   pressure was rechecked prior to discharge and was found to be 146/92.  The   patient did receive a dose of Fragmin and will return in the morning for a   bilateral lower extremity Duplex.     LABORATORY DATA:  CBC is unremarkable, normal white count, normal H&H.   Electrolytes are normal with a creatinine less than 1.  Her albumin is   normal.  Urinalysis demonstrates no evidence of protein, trace ketones.   Coags demonstrated an INR of 1.3.     MEDICAL DECISION MAKING:  Based on the patient's history and description of   symptoms my suspicion for a DVT is relatively low, however, she does have a   previous history of this.  Therefore, we will complete evaluation by having   her come back in the morning when a Duplex scan can be performed.  Because   the patient is not currently therapeutic on Coumadin we will give her a dose   of Fragmin at 100 units/kg.  Likely the edema is secondary to her activity   and potentially can be related to her poor venous return from her lower   extremities given her previous history of DVT in the setting of hypertension.   She does not have evidence at this time that is concerning for a possible PE.     DIAGNOSIS:     1.   Bilateral lower extremity edema.     PLAN:     1.  The patient was instructed to elevate her legs.   2.  Return to the emergency department for a Duplex.  She was given a     prescription with instructions for a bilateral lower extremity Duplex scan.   3.  Told to return to the emergency department for worsening pain, shortness     of breath, chest pain or other concerns.   4.  Follow-up with her primary care physician this week.     The patient verbalized understanding of the discharge instructions and agreed   with the plan.     DISPOSITION:  Discharged.     CONDITION:  Stable.       *-*-*                                             _______________________________________   SRB/nen                                _____   D:  07/06/2010 00:06                  Loralie Champagne, M.D.   T:  07/06/2010 01:27   Job #:  [id number]     c:   Knute Neu. Duanne Limerick, M.D.                                EMERGENCY DEPARTMENT NOTE                                                                PAGE    1 of   1   T:  07/06/2010 01:27   Job #:  [id number]     c:   Knute Neu. Duanne Limerick, M.D.                                EMERGENCY DEPARTMENT NOTE  PAGE    1 of   1

## 2010-08-27 LAB — COMPREHENSIVE METABOLIC PANEL
ALT: 18 U/L (ref 10–40)
AST: 20 U/L (ref 15–37)
Albumin/Globulin Ratio: 1.7 (ref 1.1–2.2)
Albumin: 4.3 gm/dl (ref 3.4–5.0)
Alkaline Phosphatase: 107 U/L (ref 45–129)
BUN: 20 mg/dl — ABNORMAL HIGH (ref 7–18)
CO2: 26 mEq/L (ref 21–32)
Calcium: 9.5 mg/dl (ref 8.3–10.6)
Chloride: 108 mEq/L (ref 99–110)
Creatinine: 0.7 mg/dl (ref 0.6–1.1)
GFR Est, African/Amer: >60
GFR, Estimated: >60 (ref >60)
Glucose: 141 mg/dl — ABNORMAL HIGH (ref 70–99)
Potassium: 4.9 mEq/L (ref 3.5–5.1)
Sodium: 143 mEq/L (ref 136–145)
Total Bilirubin: 0.4 mg/dl (ref 0.0–1.0)
Total Protein: 6.8 gm/dl (ref 6.4–8.2)

## 2010-08-27 LAB — TSH
T4 Free: 1.13 ng/dl (ref 0.9–1.8)
TSH: 1.93 u[IU]/mL (ref 0.35–5.5)

## 2010-08-27 LAB — IRON AND TIBC
Iron Saturation: 18 % (ref 15–50)
Iron: 61 ug/dl (ref 35–150)
TIBC: 345 ug/dl (ref 260–445)

## 2010-08-27 LAB — CBC WITH AUTO DIFFERENTIAL
Basophils %: 0.5 % (ref 0.0–2.0)
Basophils Absolute: 0 10*3 (ref 0.0–0.2)
Eosinophils %: 1.4 % (ref 0.0–5.0)
Eosinophils Absolute: 0.1 10*3 (ref 0.0–0.6)
Granulocyte Absolute Count: 3.3 10*3 (ref 1.7–7.7)
Hematocrit: 39.4 % (ref 36.0–48.0)
Hemoglobin: 13.2 gm/dl (ref 12.0–16.0)
Lymphocytes %: 34 % (ref 25.0–40.0)
Lymphocytes Absolute: 2 10*3 (ref 1.0–5.1)
MCH: 28.4 pg (ref 26–34)
MCHC: 33.4 gm/dl (ref 31–36)
MCV: 85 fl (ref 80–100)
MPV: 10.4 fl (ref 5.0–10.5)
Monocytes %: 7.8 % (ref 0.0–12.0)
Monocytes Absolute: 0.5 10*3 (ref 0.0–1.3)
Platelets: 205 10*3 (ref 135–450)
RBC: 4.64 10*6 (ref 4.0–5.2)
RDW: 15.9 % — ABNORMAL HIGH (ref 11.5–14.5)
Segs Relative: 56.3 % (ref 42.0–63.0)
WBC: 5.9 10*3 (ref 4.0–11.0)

## 2010-08-27 LAB — LIPID PANEL
Cholesterol, Total: 185 mg/dl (ref <200)
HDL: 41 mg/dl (ref 40–60)
LDL Calculated: 124 mg/dl — ABNORMAL HIGH (ref <100)
Triglycerides: 100 mg/dl (ref <150)
VLDL Cholesterol Calculated: 20 mg/dl

## 2010-08-27 LAB — VITAMIN B12 & FOLATE
Folate: 16.93 ng/ml
Vitamin B-12: 212 pg/ml (ref 211–911)

## 2010-08-27 LAB — T4, FREE: T4 Free: 1.13 ng/dl (ref 0.9–1.8)

## 2010-08-30 LAB — VITAMIN D 25 HYDROXY: Vit D, 25-Hydroxy: 30 ng/ml (ref 30–80)

## 2010-09-17 NOTE — Unmapped (Signed)
[facility]     PATIENT NAME:   Tamara Cox, Tamara Cox                  MR #:  [id number]   DATE OF BIRTH:  1970-07-27                        ACCOUNT #:  [id number]   ED PHYSICIAN:   Lum Keas, M.D.            ROOM #:   PRIMARY:        Knute Neu. Duanne Limerick, M.D.            NURSING UNIT:  ED   REFERRING:                                        FC:  C   DICTATED BY:    Lum Keas, M.D.            ADMIT DATE:  09/16/2010   VISIT DATE:     09/16/2010                        DISCHARGE DATE:                               EMERGENCY DEPARTMENT NOTE     *-*-*     CHIEF COMPLAINT:  I'm having gallbladder issues.     HISTORY OF PRESENT ILLNESS:  This is a 40 year old female who complains of   upper abdominal pain going through to her back.  The patient states that this   started approximately one hour prior to arrival at approximately 2100 hours.   She has been nauseous with this and vomited.  No diarrhea.  The patient   denies any chest pain with this.  There has been no diaphoresis and no   radiation of her chest or shoulders.  The patient states she had similar   problems approximately two weeks ago, once again the epigastric pain   radiating through to her back.  She took some pain medication she had at the   time for some other surgery and the pain seemed to go away.  Because of the   continued symptoms, the patient is now here for further treatment and   evaluation.  She has no fevers, chills or sweats.  Outside two weeks ago, she   has no other history of this type of problem.  She had no shortness of breath   with this.  No chest pain.     PAST MEDICAL HISTORY:     1.  Diabetes.   2.  Hypertension.   3.  Depression.   4.  Arthritis.   5.  A broken foot.   6.  Pulmonary emboli.     CURRENT MEDICATIONS:  Listed in the medicine reconciliation form, they were   reviewed.     ALLERGIES:     1.  Penicillin.   2.  Codeine.   3.  Sulfa.   4.  Quinolones.     SOCIAL HISTORY:  The patient  denies tobacco, alcohol or drug use.     REVIEW OF SYSTEMS:  Negative outside those mentioned in history of present   illness.     FAMILY HISTORY:  Noncontributory.     PHYSICAL EXAMINATION:   GENERAL:  Reveals an alert 40 year old female sitting up in bed looking   uncomfortable as I walk in the room.   VITAL SIGNS:  BP is 157/99, pulse 78, respirations 16, temperature 97.8, O2   sat 97% on room air.   HEENT:  Atraumatic, normocephalic.  Extraocular muscles intact.  Oropharynx   is clear.   NECK:  Supple without meningismus or JVD.   CHEST:  Reveals equal breath sounds bilaterally.   HEART:  Regular rate and rhythm without appreciable murmurs, rubs or gallops.   ABDOMEN:  Soft, bowel sounds are present.  She is very tender to palpation in   the epigastric and right upper quadrant regions, minimally tender in the left   upper quadrant, not painful and lower quadrant.  There is no rebound or   guarding.   EXTREMITIES:  Have no clubbing, cyanosis.  She has 1+ edema of both lower   extremities.  She has palpable pulses in all four extremities that are equal.   NEUROLOGIC:  The patient is awake, alert, oriented, moves all four   extremities equally, has no focal motor or sensory deficits.  Cranial nerves   II through XII are intact.   SKIN:  Warm and dry without rash.     EMERGENCY DEPARTMENT COURSE:  The patient was seen and examined.  Dilaudid   was given 1 mg IV at 23:00, along with Zofran for nausea and with normal   saline one liter wide open.     LABORATORY DATA:  The patient had laboratory studies done, which showed a   white count 7.2, hemoglobin 13.2, hematocrit 39.3.  Electrolytes were normal.   Glucose 193 on a nonfasting specimen.  Liver functions were normal.  Alkaline   phosphatase was minimally elevated at 120.  Urinalysis showed trace ketones,   otherwise not infected.     Re-evaluation at 0015 hours, the patient was awake, alert, oriented.  She   felt much improved.  Her abdominal examination was  significant improvement.   She had minimal discomfort on palpation in the epigastric and right upper   quadrant regions.  The patient, at this point, I think it reasonable for   discharge home.  I explained to the patient that this could be gallbladder   disease and ultrasound is necessary for final diagnosis.  I also asked her   about whether she has had problems with ulcers in the past.  She says she has   had GERD and she normally takes Prevacid but has been off it for a while.   Therefore, I told the patient she needs to go back immediately on her   Prevacid and start taking on a regular basis.  The patient knows to return   immediately for any sudden worsening of her symptoms or change in her   symptoms or if she is not improving in 6-12 hours.  Otherwise, she is to   follow up with her primary care physician.  She is to take her Prevacid   daily.  I will write for 10 Percocet, no refill, for pain.  She is to return   to the emergency department immediately for any sudden worsening or change in   her symptoms.     DIAGNOSIS:     1.  Epigastric and right upper quadrant abdominal pain.     DISPOSITION:  The patient will be discharged home.       *-*-*  _______________________________________   WJN/kd                                 _____   D:  09/17/2010 00:25                  Lum Keas, M.D.   T:  09/17/2010 11:36   Job #:  [id number]                                    EMERGENCY DEPARTMENT NOTE                                                                PAGE    1 of   1   D:  09/17/2010 00:25                  Lum Keas, M.D.   T:  09/17/2010 11:36   Job #:  [id number]                                    EMERGENCY DEPARTMENT NOTE                                                                PAGE    1 of   1

## 2010-09-18 LAB — CBC WITH AUTO DIFFERENTIAL
Basophils %: 0.3 % (ref 0.0–2.0)
Basophils Absolute: 0 10*3 (ref 0.0–0.2)
Eosinophils %: 1.5 % (ref 0.0–5.0)
Eosinophils Absolute: 0.1 10*3 (ref 0.0–0.6)
Granulocyte Absolute Count: 4.5 10*3 (ref 1.7–7.7)
Hematocrit: 39.8 % (ref 36.0–48.0)
Hemoglobin: 13.3 gm/dl (ref 12.0–16.0)
Lymphocytes %: 29.8 % (ref 25.0–40.0)
Lymphocytes Absolute: 2.2 10*3 (ref 1.0–5.1)
MCH: 28.6 pg (ref 26–34)
MCHC: 33.4 gm/dl (ref 31–36)
MCV: 85.4 fl (ref 80–100)
MPV: 9.8 fl (ref 5.0–10.5)
Monocytes %: 6.2 % (ref 0.0–12.0)
Monocytes Absolute: 0.5 10*3 (ref 0.0–1.3)
Platelets: 226 10*3 (ref 135–450)
RBC: 4.66 10*6 (ref 4.0–5.2)
RDW: 15.3 % — ABNORMAL HIGH (ref 11.5–14.5)
Segs Relative: 62.2 % (ref 42.0–63.0)
WBC: 7.3 10*3 (ref 4.0–11.0)

## 2010-09-18 LAB — COMPREHENSIVE METABOLIC PANEL
ALT: 30 U/L (ref 10–40)
AST: 25 U/L (ref 15–37)
Albumin/Globulin Ratio: 1.7 (ref 1.1–2.2)
Albumin: 4.5 gm/dl (ref 3.4–5.0)
Alkaline Phosphatase: 133 U/L — ABNORMAL HIGH (ref 45–129)
BUN: 18 mg/dl (ref 7–18)
CO2: 28 mEq/L (ref 21–32)
Calcium: 9.6 mg/dl (ref 8.3–10.6)
Chloride: 105 mEq/L (ref 99–110)
Creatinine: 0.8 mg/dl (ref 0.6–1.1)
GFR Est, African/Amer: >60
GFR, Estimated: >60 (ref >60)
Glucose: 119 mg/dl — ABNORMAL HIGH (ref 70–99)
Potassium: 4.7 mEq/L (ref 3.5–5.1)
Sodium: 142 mEq/L (ref 136–145)
Total Bilirubin: 0.5 mg/dl (ref 0.0–1.0)
Total Protein: 7.2 gm/dl (ref 6.4–8.2)

## 2010-09-18 LAB — LIPASE: Lipase: 33.71 U/L (ref 5.6–51.3)

## 2010-09-18 LAB — AMYLASE: Amylase: 42 U/L (ref 25–115)

## 2010-10-06 ENCOUNTER — Inpatient Hospital Stay

## 2011-05-04 NOTE — Unmapped (Signed)
[facility]     PATIENT NAME:   Tamara Cox, Tamara Cox                  MR #:  [id number]  DATE OF BIRTH:  1970-11-30                        ACCOUNT #:  [id number]  ED PHYSICIAN:   Martie Lee D. Etheleen Mayhew, M.D.            ROOM #:  PRIMARY:        Knute Neu. Duanne Limerick, M.D.            NURSING UNIT:  ED  REFERRING:                                        FC:  C  DICTATED BY:    Averi Cacioppo D. Etheleen Mayhew, M.D.            ADMIT DATE:  05/04/2011  VISIT DATE:     05/04/2011                        DISCHARGE DATE:                               EMERGENCY DEPARTMENT NOTE     *-*-*     ADDENDUM - 05/05/2011 - MD     The patient is a pleasant 40 year old female with a past medical history of  sinusitis and sinus headaches who presents to the emergency room complaining  of a headache.  I have seen the patient, assessed and agree with the plan.        *-*-*                                              _______________________________________  SDL/md                                 _____  D:  05/04/2011 20:37                  Sabas Frett D. Etheleen Mayhew, M.D.  T:  05/05/2011 06:29  Job #:  [id number]     c:   Knute Neu. Duanne Limerick, M.D.                                EMERGENCY DEPARTMENT NOTE                                                               PAGE    1 of   1

## 2011-05-04 NOTE — Unmapped (Signed)
[place]                              [facility]     PATIENT NAME:   Tamara Cox, Tamara Cox               MR #:  [id number]  DATE OF BIRTH:  May 01, 1971                     ACCOUNT #:  [id number]  ED PHYSICIAN:   Martie Lee D. Etheleen Mayhew, M.D.         ADMIT DATE:  05/04/2011  DICTATED BY:    Evelene Croon, P.A.           VISIT DATE:  05/04/2011                               EMERGENCY DEPARTMENT NOTE     *-*-*     CHIEF COMPLAINT:  Headache.     HISTORY OF PRESENT ILLNESS:  The patient is a 40 year old female who presents  with migraine.  She has had a migraine since earlier this morning.  She did  take two Vicodin which gave minimal relief of symptoms.  She does have a  history of migraines.  She reports no fevers or chills.  No neck pain.  No  chest pain, shortness of breath or abdominal pain.  This migraine feels  fairly similar to her normal migraines, however, she did not get relief with  Vicodin.     PAST MEDICAL HISTORY:     1.  Diabetes.  2.  Hypertension.  3.  Migraines.  4.  Hypothyroidism.  5.  Arthritis.  6.  Gastritis.  7.  History of blood clots.  8.  Hysterectomy.  9.  Orthopedic surgery.     SOCIAL HISTORY:  No alcohol, tobacco or illicit drugs.     MEDICATIONS:  See ED notes.     ALLERGIES:     1.  Penicillin.  2.  Codeine.  3.  Sulfa.  4.  Quinolones.     REVIEW OF SYSTEMS:  As mentioned, otherwise negative.     PHYSICAL EXAMINATION:     VITAL SIGNS:  Blood pressure 146/88, pulse 100, respirations 18, temperature  99.7, O2 saturation 95% on room air.  GENERAL:  Awake and alert, with mild distress secondary to migraine.  HEENT:  Normocephalic, atraumatic.  She does tenderness to palpation of the  bones of the skull.  Pupils are equal, round, reactive to light and  accommodation.  Extraocular movements are intact.  CHEST:  Clear breath sounds bilaterally without wheezes, rales or rhonchi.  HEART:  Regular rate and rhythm.  No murmurs, rubs or gallops.  ABDOMEN:  Soft,  nontender, nondistended, good bowel sounds with no  organomegaly.  EXTREMITIES:  No clubbing, cyanosis or edema.  NEUROLOGIC:  Cranial nerves are intact.  Reflexes are normal.  Sensation is  grossly intact.     EMERGENCY DEPARTMENT COURSE:  This young lady is seen and evaluated in the  emergency department for migraine-type headache.  There was no thunderclap  presentation.  It was not the worst headache of her life.  She has no  meningismus signs.     Here in the emergency department she was given a liter of fluids.  She was  given droperidol for nausea, Toradol and Benadryl, which gave  some relief of  symptoms.  We did end up giving her Dilaudid 1 mg IV as well and this did  give total relief of symptoms.  At this time I think her symptoms may be more  migrainous with some sinus on top.  We will send her home with Fioricet to  take at night if headache returns.  We also gave her a prescription for  antibiotics to take if she develops fever or discharge or drainage.  She was  advised to follow up with primary care physician and return if anything  worsens.     DIAGNOSIS:     1.  Cephalgia.     DISPOSITION:  The patient is discharged from the emergency room in stable  condition.     The patient was seen by the attending physician, who agrees with assessment  and plan.        *-*-*                                              Evelene Croon, P.A.Sabrina D. Etheleen Mayhew,  RA/pt                                 M.D.  D:  05/23/2011 14:00  T:  05/24/2011 08:37  Job #:  [id number]           EMERGENCY DEPARTMENT NOTE                                    PAGE    1 of   1

## 2011-05-07 LAB — COMPREHENSIVE METABOLIC PANEL
ALT: 24 U/L (ref 10–40)
AST: 19 U/L (ref 15–37)
Albumin/Globulin Ratio: 2 (ref 1.1–2.2)
Albumin: 4.5 gm/dl (ref 3.4–5.0)
Alkaline Phosphatase: 106 U/L (ref 45–129)
BUN: 21 mg/dl — ABNORMAL HIGH (ref 7–18)
CO2: 28 mEq/L (ref 21–32)
Calcium: 9.6 mg/dl (ref 8.3–10.6)
Chloride: 107 mEq/L (ref 99–110)
Creatinine: 0.8 mg/dl (ref 0.6–1.1)
GFR Est, African/Amer: >60
GFR, Estimated: >60 (ref >60)
Glucose: 127 mg/dl — ABNORMAL HIGH (ref 70–99)
Potassium: 4.3 mEq/L (ref 3.5–5.1)
Sodium: 144 mEq/L (ref 136–145)
Total Bilirubin: 0.5 mg/dl (ref 0.0–1.0)
Total Protein: 6.7 gm/dl (ref 6.4–8.2)

## 2011-05-07 LAB — LIPID PANEL
Cholesterol, Total: 176 mg/dl (ref <200)
HDL: 35 mg/dl — ABNORMAL LOW (ref 40–60)
LDL Calculated: 121 mg/dl — ABNORMAL HIGH (ref <100)
Triglycerides: 100 mg/dl (ref <150)
VLDL Cholesterol Calculated: 20 mg/dl

## 2011-05-07 LAB — CBC WITH AUTO DIFFERENTIAL
Basophils %: 0.4 % (ref 0.0–2.0)
Basophils Absolute: 0 10*3 (ref 0.0–0.2)
Eosinophils %: 0.8 % (ref 0.0–5.0)
Eosinophils Absolute: 0 10*3 (ref 0.0–0.6)
Granulocyte Absolute Count: 3.9 10*3 (ref 1.7–7.7)
Hematocrit: 39.6 % (ref 36.0–48.0)
Hemoglobin: 13.1 gm/dl (ref 12.0–16.0)
Lymphocytes %: 26.4 % (ref 25.0–40.0)
Lymphocytes Absolute: 1.6 10*3 (ref 1.0–5.1)
MCH: 28.7 pg (ref 26–34)
MCHC: 33.1 gm/dl (ref 31–36)
MCV: 86.6 fl (ref 80–100)
MPV: 10.8 fl — ABNORMAL HIGH (ref 5.0–10.5)
Monocytes %: 7.5 % (ref 0.0–12.0)
Monocytes Absolute: 0.5 10*3 (ref 0.0–1.3)
Platelets: 199 10*3 (ref 135–450)
RBC: 4.58 10*6 (ref 4.0–5.2)
RDW: 15.4 % — ABNORMAL HIGH (ref 11.5–14.5)
Segs Relative: 64.9 % — ABNORMAL HIGH (ref 42.0–63.0)
WBC: 6 10*3 (ref 4.0–11.0)

## 2011-05-07 LAB — T4, FREE: T4 Free: 1.27 ng/dl (ref 0.9–1.8)

## 2011-05-07 LAB — TSH
T4 Free: 1.27 ng/dl (ref 0.9–1.8)
TSH: 2.9 u[IU]/mL (ref 0.35–5.5)

## 2011-05-08 LAB — VITAMIN D 25 HYDROXY: Vit D, 25-Hydroxy: 32 ng/ml (ref 30–80)

## 2011-10-21 ENCOUNTER — Inpatient Hospital Stay: Admit: 2011-10-21 | Payer: PRIVATE HEALTH INSURANCE

## 2011-10-21 DIAGNOSIS — M799 Soft tissue disorder, unspecified: Secondary | ICD-10-CM

## 2011-10-28 LAB — CBC WITH AUTO DIFFERENTIAL
Basophils %: 0.6 % (ref 0.0–2.0)
Basophils Absolute: 0 10*3 (ref 0.0–0.2)
Eosinophils %: 1.2 % (ref 0.0–5.0)
Eosinophils Absolute: 0.1 10*3 (ref 0.0–0.6)
Granulocyte Absolute Count: 3.5 10*3 (ref 1.7–7.7)
Hematocrit: 38.8 % (ref 36.0–48.0)
Hemoglobin: 12.9 gm/dl (ref 12.0–16.0)
Lymphocytes %: 33.4 % (ref 25.0–40.0)
Lymphocytes Absolute: 2.1 10*3 (ref 1.0–5.1)
MCH: 28.8 pg (ref 26–34)
MCHC: 33.2 gm/dl (ref 31–36)
MCV: 86.7 fl (ref 80–100)
MPV: 11.1 fl — ABNORMAL HIGH (ref 5.0–10.5)
Monocytes %: 7.7 % (ref 0.0–12.0)
Monocytes Absolute: 0.5 10*3 (ref 0.0–1.3)
Platelets: 201 10*3 (ref 135–450)
RBC: 4.48 10*6 (ref 4.0–5.2)
RDW: 14.7 % — ABNORMAL HIGH (ref 11.5–14.5)
Segs Relative: 57.1 % (ref 42.0–63.0)
WBC: 6.2 10*3 (ref 4.0–11.0)

## 2011-10-28 LAB — BASIC METABOLIC PANEL
BUN: 26 mg/dl — ABNORMAL HIGH (ref 7–18)
CO2: 27 mEq/L (ref 21–32)
Calcium: 8.6 mg/dl (ref 8.3–10.6)
Chloride: 108 mEq/L (ref 99–110)
Creatinine: 0.6 mg/dl (ref 0.6–1.1)
GFR Est, African/Amer: >60
GFR, Estimated: >60 (ref >60)
Glucose: 110 mg/dl — ABNORMAL HIGH (ref 70–99)
Potassium: 4.2 mEq/L (ref 3.5–5.1)
Sodium: 145 mEq/L (ref 136–145)

## 2011-12-12 ENCOUNTER — Emergency Department: Admit: 2011-12-13 | Payer: PRIVATE HEALTH INSURANCE

## 2011-12-12 DIAGNOSIS — M5430 Sciatica, unspecified side: Secondary | ICD-10-CM

## 2011-12-12 MED ORDER — oxyCODONE-acetaminophen (PERCOCET) 5-325 mg per tablet 1 tablet
5-325 | Freq: Once | ORAL | Status: AC
Start: 2011-12-12 — End: 2011-12-12
  Administered 2011-12-13: 02:00:00 via ORAL

## 2011-12-12 MED ORDER — HYDROcodone-acetaminophen (NORCO) 5-325 mg per tablet
5-325 | ORAL_TABLET | Freq: Four times a day (QID) | ORAL | 0.00 refills | 15.50000 days | Status: AC | PRN
Start: 2011-12-12 — End: 2013-10-27

## 2011-12-12 MED ORDER — naproxen (NAPROSYN) 500 MG tablet
500 | ORAL_TABLET | Freq: Two times a day (BID) | ORAL | Status: AC | PRN
Start: 2011-12-12 — End: 2012-09-24

## 2011-12-12 MED ORDER — ketorolac (TORADOL) injection 60 mg
60 | Freq: Once | INTRAMUSCULAR | Status: AC
Start: 2011-12-12 — End: 2011-12-12
  Administered 2011-12-13: 02:00:00 via INTRAMUSCULAR

## 2011-12-12 MED FILL — KETOROLAC 60 MG/2 ML INTRAMUSCULAR SOLUTION: 60 60 mg/2 mL | INTRAMUSCULAR | Qty: 2

## 2011-12-12 MED FILL — OXYCODONE-ACETAMINOPHEN 5 MG-325 MG TABLET: 5-325 5-325 mg | ORAL | Qty: 1

## 2011-12-12 NOTE — Unmapped (Signed)
Lumbar Radiculopathy, Sciatica  Sciatica is a weakness and/or changes in sensation (tingling, jolts, hot and cold, numbness) along the path the sciatic nerve travels. Irritation or damage to lumbar nerve roots is often also referred to as lumbar radiculopathy.   Lumbar radiculopathy (Sciatica) is the most common form of this problem. Radiculopathy can occur in any of the nerves coming out of the spinal cord. The problems caused depend on which nerves are involved. The sciatic nerve is the large nerve supplying the branches of nerves going from the hip to the toes. It often causes a numbness or weakness in the skin and/or muscles that the sciatic nerve serves. It also may cause symptoms (problems) of pain, burning, tingling, or electric shock-like feelings in the path of this nerve. This usually comes from injury to the fibers that make up the sciatic nerve. Some of these symptoms are low back pain and/or unpleasant feelings in the following areas:  ?? From the mid-buttock down the back of the leg to the back of the knee.   ?? And/or the outside of the calf and top of the foot.   ?? And/or behind the inner ankle to the sole of the foot.   CAUSES  ?? Herniated or slipped disc. Discs are the little cushions between the bones in the back.   ?? Pressure by the piriformis muscle in the buttock on the sciatic nerve (Piriformis Syndrome).   ?? Misalignment of the bones in the lower back and buttocks (Sacroiliac Joint Derangement).   ?? Narrowing of the spinal canal that puts pressure on or pinches the fibers that make up the sciatic nerve.   ?? A slipped vertebra that is out of line with those above or beneath it.   ?? Abnormality of the nervous system itself so that nerve fibers do not transmit signals properly, especially to feet and calves (neuropathy).   ?? Tumor (this is rare).   Your caregiver can usually determine the cause of your sciatica and begin the treatment most likely to help you.  TREATMENT  Taking over-the-counter  painkillers, physical therapy, rest, exercise, spinal manipulation, and injections of anesthetics and/or steroids may be used. Surgery, acupuncture, and Yoga can also be effective. Mind over matter techniques, mental imagery, and changing factors such as your bed, chair, desk height, posture, and activities are other treatments that may be helpful. You and your caregiver can help determine what is best for you. With proper diagnosis, the cause of most sciatica can be identified and removed. Communication and cooperation between your caregiver and you is essential. If you are not successful immediately, do not be discouraged. With time, a proper treatment can be found that will make you comfortable.  HOME CARE INSTRUCTIONS  ?? If the pain is coming from a problem in the back, applying ice to that area for 20  minutes, 5  times per day while awake, may be helpful. Put the ice in a plastic bag. Place a towel between the bag of ice and your skin.   ?? You may exercise or perform your usual activities if these do not aggravate your pain, or as suggested by your caregiver.   ?? Only take over-the-counter or prescription medicines for pain, discomfort, or fever as directed by your caregiver.   ?? If your caregiver has given you a follow-up appointment, it is very important to keep that appointment. Not keeping the appointment could result in a chronic or permanent injury, pain, and disability. If there is any   problem keeping the appointment, you must call back to this facility for assistance.   SEEK IMMEDIATE MEDICAL CARE IF:  ?? You experience loss of control of bowel or bladder.   ?? You have increasing weakness in the trunk, buttocks, or legs.   ?? There is numbness in any areas from the hip down to the toes.   ?? You have difficulty walking or keeping your balance.   ?? You have any of the above, with fever or forceful vomiting.   Document Released: 06/28/2001 Document Re-Released: 07/26/2009  [employer]?? Patient Information  ??2012 [employer].

## 2011-12-12 NOTE — Unmapped (Signed)
Discharge instructions given [id number]  Verbalized understanding.  Pain level:4  [id number], norco  Pt educated on names, uses and side effects of all prescriptions.  Pt verbalizes understanding of all prescriptions given.  Pt. Given narcotic in Emergency Department; pt. States they are not driving and have a responsible person for transport when leaving Emergency Department.   Pt. Given narcotic prescription, advised not to drink alcohol or drive a car while taking narcotic.  Pt. Instructed to not take additional acetaminophen if given a narcotic with acetaminophen.  Method of departure from Emergency Department:ambulatory

## 2011-12-12 NOTE — Unmapped (Signed)
Report back pain since Saturday no know injury but might have gotten out of the car wrong

## 2011-12-12 NOTE — Unmapped (Signed)
Pt resting quietly in room watching TV with spouse at bedside.  NAD noted.  Aware waiting on radiology results.

## 2011-12-12 NOTE — Unmapped (Addendum)
[facility] ED Note    Reason for Visit: Back Pain      Patient History     HPI Tamara Cox is a 41 year old female who presented to the emergency department with lower lumbar pain over the L4-L5 area.  She states she is history of chronic back pain has had intermittent recurrences of back pain and states for the last 3-5 day.  She's been having worsening pain of the lumbar area radiating to her right leg.  She denies any weakness.  Denies they did difficulty.  She denies any bowel or bladder habit changes.  She has no focal injuries she denies any falls.  She no fevers or chills and no concerning history on his factors for epidural abscess or discitis.       Past Medical History   Diagnosis Date   ??? Arthritis    ??? Diabetes mellitus    ??? DVT (deep venous thrombosis)        Past Surgical History   Procedure Date   ??? Hysterectomy    ??? Hernia repair    ??? Knee surgery right   ??? Tumor excision right arm   ??? Foot surgery right       Patient  reports that she has never smoked. She does not have any smokeless tobacco history on file. She reports that she does not drink alcohol. Her drug history not on file.      Previous Medications    CYCLOBENZAPRINE (FLEXERIL) 10 MG TABLET    Take 10 mg by mouth 3 times a day as needed.    GABAPENTIN (NEURONTIN) 300 MG TABLET    Take 300 mg by mouth at bedtime.    LIRAGLUTIDE (VICTOZA 2-PAK) 0.6 MG/0.1 ML (18 MG/3 ML) PNIJ    Inject 1.8 mg subcutaneously daily.    RAMIPRIL (ALTACE) 10 MG TABLET    Take 10 mg by mouth daily.    SITAGLIPTAN-METFORMIN (JANUMET) 50-1,000 MG PER TABLET    Take 1 tablet by mouth 2 times a day with meals.       Allergies:   Allergies as of 12/12/2011 - Fully Reviewed 12/12/2011   Allergen Reaction Noted   ??? Penicillins Hives 12/12/2011   ??? Quinolones Hives 12/12/2011   ??? Sulfa (sulfonamide antibiotics) Hives 12/12/2011       Review of Systems     Review of Systems    Constitutional:  Denies fever or chills      Eyes:  Denies change in visual acuity   HENT:  Denies nasal congestion or sore throat   Respiratory:  Denies cough or shortness of breath   Cardiovascular:  Denies chest pain or edema   GI:  Denies abdominal pain, nausea, vomiting, bloody stools or diarrhea   GU:  Denies dysuria   Musculoskeletal:  Denies back pain or joint pain   Integument:  Denies rash   Neurologic:  Denies headache, focal weakness or sensory changes   Endocrine:  Denies polyuria or polydipsia   Lymphatic:  Denies swollen glands   Psychiatric:  Denies depression or anxiety     Physical Exam     ED Triage Vitals   Vital Signs Group      Temp 12/12/11 2116 98 ??F (36.7 ??C)      Temp Source 12/12/11 2116 Oral      Heart Rate 12/12/11 2116 80       Heart Rate Source 12/12/11 2116 Monitor;Automatic      Resp 12/12/11 2116 20  BP 12/12/11 2116 138/85 mmHg      BP Location 12/12/11 2116 Right arm      BP Method 12/12/11 2116 Automatic      Patient Position 12/12/11 2116 Sitting   SpO2 12/12/11 2116 97 %   O2 Device 12/12/11 2116 None (Room air)       Physical Exam  Constitutional:  Well developed, well nourished, no acute distress, non-toxic appearance   Eyes:  PERRL, conjunctiva normal   HENT:  Atraumatic, external ears normal, nose normal, oropharynx moist, no pharyngeal exudates. Neck- normal range of motion, no tenderness, supple   Respiratory:  No respiratory distress, normal breath sounds, no rales, no wheezing   Cardiovascular:  Normal rate, normal rhythm, no murmurs, no gallops, no rubs   GI:  Soft, nondistended, normal bowel sounds, nontender, no organomegaly, no mass, no rebound, no guarding   GU:  No costovertebral angle tenderness   Musculoskeletal:  No edema, no tenderness, no deformities. Back- lumbar in the midline which has tenderness at L4- L5  Integument:  Well hydrated, no rash   Lymphatic:  No lymphadenopathy noted   Neurologic:  Alert & oriented x 3, CN 2-12 normal, normal motor function, normal sensory function, no focal  deficits noted      Diagnostic Studies     Labs:    Please see electronic medical record for any tests performed in the ED    Radiology:    Please see electronic medical record for any tests performed in the ED    EKG:    No EKG Performed    Emergency Department Procedures     Procedures none    ED Course and MDM     Tamara Cox is a 41 y.o. female who presented to the emergency department with Back Pain   the patient presented to the emergency department with lower lumbar back pain and discomfort.  The patient pain reproducible at L4-L5.  She does have symptoms concerning for sciatica.  The patient had CT L-spine for evaluation of disc disease.  CT L-spine shows shows arthritic changes L5-S1 and patient has slight changes of L4-L5 however there is no central cord lesions noted.  The patient be followed with her primary care physician for further followup regarding her back pain and sciatica       Critical Care Time (Attendings)   None  Final Impression is  sciatica  Disposition is discharge to home          Cherly Anderson, MD  12/12/11 1610    Cherly Anderson, MD  12/12/11 2309

## 2011-12-12 NOTE — Unmapped (Signed)
Pt. Medicated per orders.  Educated on medication names, reason for use and side effects. All questions answered. Pt./family verbalizes understanding.

## 2011-12-13 ENCOUNTER — Inpatient Hospital Stay
Admit: 2011-12-13 | Discharge: 2011-12-13 | Disposition: A | Discharge status: Home / Self Care | Payer: PRIVATE HEALTH INSURANCE

## 2012-08-25 LAB — CBC WITH AUTO DIFFERENTIAL
Basophils %: 0.2 %
Basophils Absolute: 0 10*3/uL (ref 0.0–0.2)
Eosinophils %: 1.9 %
Eosinophils Absolute: 0.1 10*3/uL (ref 0.0–0.6)
Hematocrit: 43.6 % (ref 36.0–48.0)
Hemoglobin: 13.9 g/dL (ref 12.0–16.0)
Lymphocytes %: 25.8 %
Lymphocytes Absolute: 1.9 10*3/uL (ref 1.0–5.1)
MCH: 27.5 pg (ref 26.0–34.0)
MCHC: 31.8 g/dL (ref 31.0–36.0)
MCV: 86.6 fL (ref 80.0–100.0)
MPV: 10.8 fL — ABNORMAL HIGH (ref 5.0–10.5)
Monocytes %: 7.1 %
Monocytes Absolute: 0.5 10*3/uL (ref 0.0–1.3)
Neutrophils %: 65 %
Neutrophils Absolute: 4.8 10*3/uL (ref 1.7–7.7)
Platelets: 250 10*3/uL (ref 135–450)
RBC: 5.04 M/uL (ref 4.00–5.20)
RDW: 14.5 % (ref 12.4–15.4)
WBC: 7.4 10*3/uL (ref 4.0–11.0)

## 2012-08-25 LAB — COMPREHENSIVE METABOLIC PANEL
ALT: 16 U/L (ref 10–40)
AST: 15 U/L (ref 15–37)
Albumin/Globulin Ratio: 1.6 (ref 1.1–2.2)
Albumin: 4.2 g/dL (ref 3.4–5.0)
Alkaline Phosphatase: 123 U/L (ref 40–129)
BUN: 21 mg/dL — ABNORMAL HIGH (ref 7–20)
CO2: 25 mmol/L (ref 21–32)
Calcium: 9.8 mg/dL (ref 8.3–10.6)
Chloride: 101 mmol/L (ref 99–110)
Creatinine: 0.6 mg/dL (ref 0.6–1.2)
GFR African American: >60 (ref >60)
GFR Non-African American: >60 (ref >60)
Globulin: 2.6 g/dL
Glucose: 147 mg/dL — ABNORMAL HIGH (ref 70–99)
Potassium: 4.2 mmol/L (ref 3.5–5.1)
Sodium: 141 mmol/L (ref 136–145)
Total Bilirubin: 0.4 mg/dL (ref 0.0–1.0)
Total Protein: 6.8 g/dL (ref 6.4–8.2)

## 2012-08-25 LAB — CK: Total CK: 43 U/L (ref 26–192)

## 2012-08-25 LAB — SEDIMENTATION RATE: Sed Rate: 20 mm/Hr (ref 0–20)

## 2012-08-25 LAB — URIC ACID: Uric Acid, Serum: 5.2 mg/dL (ref 2.6–6.0)

## 2012-08-25 LAB — TSH: TSH: 3.12 u[IU]/mL (ref 0.27–4.20)

## 2012-08-25 LAB — RHEUMATOID FACTOR: Rheumatoid Factor: <10 IU/mL (ref <14)

## 2012-08-25 LAB — T4, FREE: T4 Free: 1.2 ng/ml (ref 0.9–1.8)

## 2012-08-26 LAB — VITAMIN D 25 HYDROXY: Vit D, 25-Hydroxy: 26 ng/mL — ABNORMAL LOW (ref 30–80)

## 2012-08-27 LAB — ANA: ANA: NEGATIVE

## 2012-09-24 ENCOUNTER — Emergency Department: Admit: 2012-09-24 | Payer: PRIVATE HEALTH INSURANCE

## 2012-09-24 ENCOUNTER — Inpatient Hospital Stay
Admit: 2012-09-24 | Discharge: 2012-09-24 | Disposition: A | Discharge status: Home / Self Care | Payer: PRIVATE HEALTH INSURANCE

## 2012-09-24 DIAGNOSIS — M25559 Pain in unspecified hip: Secondary | ICD-10-CM

## 2012-09-24 MED ORDER — oxyCODONE-acetaminophen (PERCOCET) 5-325 mg per tablet
5-325 | ORAL_TABLET | Freq: Four times a day (QID) | ORAL | Status: AC | PRN
Start: 2012-09-24 — End: 2013-10-27

## 2012-09-24 NOTE — Unmapped (Signed)
Pt c/o right upper leg pain x 1 day after slipping getting out of the shower yesterday morning

## 2012-09-24 NOTE — Other (Unsigned)
[facility] ED Note    Date of service:  09/24/2012    Reason for Visit: Leg Pain      Patient History    HPI    This young lady presents to the emergency department with an injury to her   left  hip.  She fell in the shower yesterday.  The left was caught on the inside of  the shower when she went down.  She has pain with weightbearing.  She has no  headache, chest pain, shortness of breath or abdominal pain.  She did not lose  consciousness in the fall.  She does have significant arthritis and is to   begin  with    Past Medical History  Diagnosis Date   Arthritis   Diabetes mellitus   DVT (deep venous thrombosis)   Obesity   PE (pulmonary embolism)      Past Surgical History  Procedure Laterality Date   Hysterectomy   Hernia repair   Knee surgery  right   Tumor excision  right arm   Foot surgery  right   Rotator cuff repair      Patient  reports that she has never smoked. She does not have any smokeless  tobacco history on file. She reports that she does not drink alcohol. Her drug  history is not on file.      Previous Medications   CELECOXIB (CELEBREX) 200 MG CAPSULE    Take 200 mg by mouth 2 times a day.   CYCLOBENZAPRINE (FLEXERIL) 10 MG TABLET    Take 10 mg by mouth 3 times a day   as  needed.   DULOXETINE (CYMBALTA) 60 MG CAPSULE    Take 60 mg by mouth daily.   GABAPENTIN (NEURONTIN) 300 MG TABLET    Take 600 mg by mouth 2 times a day.   HYDROCODONE-ACETAMINOPHEN (NORCO) 5-325 MG PER TABLET    Take 1 tablet by   mouth  every 6 hours as needed for Pain.   LIRAGLUTIDE (VICTOZA 2-PAK) 0.6 MG/0.1 ML (18 MG/3 ML) PNIJ    Inject 1.8 mg  subcutaneously daily.   RAMIPRIL (ALTACE) 10 MG TABLET    Take 10 mg by mouth daily.      Allergies:  Allergies as of 09/24/2012 - Fully Reviewed 09/24/2012  Allergen Reaction Noted   Penicillins Hives 12/12/2011   Quinolones Hives 12/12/2011   Sulfa (sulfonamide antibiotics) Hives 12/12/2011      Review of Systems    Review of  Systems    Review systems is as mentioned otherwise negative    Physical Exam    ED Triage Vitals  Vital Signs Group     Temp --     Temp src --     Pulse --     Heart Rate Source --     Resp --     BP --     BP Location --     BP Method --     Patient Position --  SpO2 --  O2 Device --      Physical Exam    Constitutional:  Well developed, well nourished, no acute distress, non-toxic  appearance  Eyes:  PERRL, conjunctiva normal  HENT:  Atraumatic, external ears normal, nose normal, oropharynx moist, no  pharyngeal exudates. Neck- normal range of motion, no tenderness, supple  Respiratory:  No respiratory distress, normal breath sounds, no rales, no  wheezing  Cardiovascular:  Normal rate, normal rhythm, no murmurs, no gallops, no  rubs  GI:  Soft, nondistended, normal bowel sounds, nontender, no organomegaly, no  mass, no rebound, no guarding  Musculoskeletal:  She has tenderness to palpation generally about the anterior  aspect of the left hip and thigh.  She has no pain with external rotation but  decent amount of discomfort with internal rotation at the hip  Neurologic:  Alert & oriented x 3, CN 2-12 normal, normal motor function,   normal  sensory function, no focal deficits noted      Diagnostic Studies    Labs:    Please see electronic medical record for any tests performed in the ED    Radiology:    Please see electronic medical record for any tests performed in the ED    EKG:    No EKG Performed    Emergency Department Procedures    Procedures    ED Course and MDM    Lasandra A Angulo is a 42 y.o. female who presented to the emergency department  with Leg Pain    This young lady is seen for an injury from a fall.  Her x-rays fortunately are  negative.  We will send her home with a short course of pain medication.  She  understands that she should call her orthopedist if pain persists and return   to  emergency department if anything worsens.      Critical Care Time (Attendings)          Evelene Croon,  PA  09/24/12 1224

## 2012-09-24 NOTE — Unmapped (Signed)
ED Attending Attestation Note    Date of service:  09/24/2012    This patient was seen by the mid-level provider.  I have seen and examined the patient, agree with the workup, evaluation, management and diagnosis.  The care plan has been discussed and I concur.      My assessment reveals a 42 y.o. female left hip pain.  Neurovasc intact.

## 2012-09-24 NOTE — Unmapped (Signed)
R. Armentano PA at bedside to review test results

## 2012-09-24 NOTE — Unmapped (Signed)
[facility] ED Note    Date of service:  09/24/2012    Reason for Visit: Leg Pain      Patient History     HPI    This young lady presents to the emergency department with an injury to her left hip.  She fell in the shower yesterday.  The left was caught on the inside of the shower when she went down.  She has pain with weightbearing.  She has no headache, chest pain, shortness of breath or abdominal pain.  She did not lose consciousness in the fall.  She does have significant arthritis and is to begin with     Past Medical History   Diagnosis Date   ??? Arthritis    ??? Diabetes mellitus    ??? DVT (deep venous thrombosis)    ??? Obesity    ??? PE (pulmonary embolism)        Past Surgical History   Procedure Laterality Date   ??? Hysterectomy     ??? Hernia repair     ??? Knee surgery  right   ??? Tumor excision  right arm   ??? Foot surgery  right   ??? Rotator cuff repair         Patient  reports that she has never smoked. She does not have any smokeless tobacco history on file. She reports that she does not drink alcohol. Her drug history is not on file.      Previous Medications    CELECOXIB (CELEBREX) 200 MG CAPSULE    Take 200 mg by mouth 2 times a day.    CYCLOBENZAPRINE (FLEXERIL) 10 MG TABLET    Take 10 mg by mouth 3 times a day as needed.    DULOXETINE (CYMBALTA) 60 MG CAPSULE    Take 60 mg by mouth daily.    GABAPENTIN (NEURONTIN) 300 MG TABLET    Take 600 mg by mouth 2 times a day.     HYDROCODONE-ACETAMINOPHEN (NORCO) 5-325 MG PER TABLET    Take 1 tablet by mouth every 6 hours as needed for Pain.    LIRAGLUTIDE (VICTOZA 2-PAK) 0.6 MG/0.1 ML (18 MG/3 ML) PNIJ    Inject 1.8 mg subcutaneously daily.    RAMIPRIL (ALTACE) 10 MG TABLET    Take 10 mg by mouth daily.       Allergies:   Allergies as of 09/24/2012 - Fully Reviewed 09/24/2012   Allergen Reaction Noted   ??? Penicillins Hives 12/12/2011   ??? Quinolones Hives 12/12/2011   ??? Sulfa (sulfonamide antibiotics) Hives 12/12/2011          Review of Systems     Review of Systems    Review systems is as mentioned otherwise negative    Physical Exam     ED Triage Vitals   Vital Signs Group      Temp --       Temp src --       Pulse --       Heart Rate Source --       Resp --       BP --       BP Location --       BP Method --       Patient Position --    SpO2 --    O2 Device --        Physical Exam    Constitutional:  Well developed, well nourished, no acute distress, non-toxic appearance   Eyes:  PERRL, conjunctiva normal   HENT:  Atraumatic, external ears normal, nose normal, oropharynx moist, no pharyngeal exudates. Neck- normal range of motion, no tenderness, supple   Respiratory:  No respiratory distress, normal breath sounds, no rales, no wheezing   Cardiovascular:  Normal rate, normal rhythm, no murmurs, no gallops, no rubs   GI:  Soft, nondistended, normal bowel sounds, nontender, no organomegaly, no mass, no rebound, no guarding   Musculoskeletal:  She has tenderness to palpation generally about the anterior aspect of the left hip and thigh.  She has no pain with external rotation but decent amount of discomfort with internal rotation at the hip  Neurologic:  Alert & oriented x 3, CN 2-12 normal, normal motor function, normal sensory function, no focal deficits noted       Diagnostic Studies     Labs:    Please see electronic medical record for any tests performed in the ED    Radiology:    Please see electronic medical record for any tests performed in the ED    EKG:    No EKG Performed    Emergency Department Procedures     Procedures    ED Course and MDM     Tamara Cox is a 42 y.o. female who presented to the emergency department with Leg Pain    This young lady is seen for an injury from a fall.  Her x-rays fortunately are negative.  We will send her home with a short course of pain medication.  She understands that she should call her orthopedist if pain persists and return to emergency department if anything worsens.        Critical Care Time (Attendings)           Evelene Croon, PA  09/24/12 1224

## 2013-01-03 ENCOUNTER — Inpatient Hospital Stay: Admit: 2013-01-03 | Payer: PRIVATE HEALTH INSURANCE

## 2013-01-03 DIAGNOSIS — R079 Chest pain, unspecified: Secondary | ICD-10-CM

## 2013-01-03 LAB — CREATININE
Creatinine: 0.66 mg/dL (ref 0.50–1.20)
GFR African American: 118 See note.
GFR Non-African American: 98 See note.

## 2013-01-03 LAB — BUN
BUN: 20 mg/dL (ref 7–25)
BUN: 20 mg/dL (ref 7–25)

## 2013-01-03 LAB — CREATININE, SERUM
Creatinine: 0.66 mg/dL (ref 0.50–1.20)
GFR MDRD Af Amer: 118 See note.
GFR MDRD Non Af Amer: 98 See note.

## 2013-02-13 LAB — CBC WITH AUTO DIFFERENTIAL
Basophils %: 0.3 %
Basophils Absolute: 0 10*3/uL (ref 0.0–0.2)
Eosinophils %: 1.9 %
Eosinophils Absolute: 0.1 10*3/uL (ref 0.0–0.6)
Hematocrit: 39.9 % (ref 36.0–48.0)
Hemoglobin: 13.3 g/dL (ref 12.0–16.0)
Lymphocytes %: 33.5 %
Lymphocytes Absolute: 1.9 10*3/uL (ref 1.0–5.1)
MCH: 28.8 pg (ref 26.0–34.0)
MCHC: 33.2 g/dL (ref 31.0–36.0)
MCV: 86.7 fL (ref 80.0–100.0)
MPV: 11.2 fL — ABNORMAL HIGH (ref 5.0–10.5)
Monocytes %: 7.8 %
Monocytes Absolute: 0.4 10*3/uL (ref 0.0–1.3)
Neutrophils %: 56.5 %
Neutrophils Absolute: 3.2 10*3/uL (ref 1.7–7.7)
Platelets: 179 10*3/uL (ref 135–450)
RBC: 4.6 M/uL (ref 4.00–5.20)
RDW: 15.3 % (ref 12.4–15.4)
WBC: 5.6 10*3/uL (ref 4.0–11.0)

## 2013-02-13 LAB — COMPREHENSIVE METABOLIC PANEL
ALT: 14 U/L (ref 10–40)
AST: 12 U/L — ABNORMAL LOW (ref 15–37)
Albumin/Globulin Ratio: 2 (ref 1.1–2.2)
Albumin: 4.2 g/dL (ref 3.4–5.0)
Alkaline Phosphatase: 105 U/L (ref 40–129)
BUN: 25 mg/dL — ABNORMAL HIGH (ref 7–20)
CO2: 26 mmol/L (ref 21–32)
Calcium: 9.5 mg/dL (ref 8.3–10.6)
Chloride: 102 mmol/L (ref 99–110)
Creatinine: 0.7 mg/dL (ref 0.6–1.1)
GFR African American: >60 (ref >60)
GFR Non-African American: >60 (ref >60)
Globulin: 2.1 g/dL
Glucose: 143 mg/dL — ABNORMAL HIGH (ref 70–99)
Potassium: 4.8 mmol/L (ref 3.5–5.1)
Sodium: 142 mmol/L (ref 136–145)
Total Bilirubin: 0.3 mg/dL (ref 0.0–1.0)
Total Protein: 6.3 g/dL — ABNORMAL LOW (ref 6.4–8.2)

## 2013-02-13 LAB — TSH: TSH: 5.95 u[IU]/mL — ABNORMAL HIGH (ref 0.27–4.20)

## 2013-02-13 LAB — LIPID PANEL
Cholesterol, Total: 176 mg/dL (ref 0–199)
HDL: 40 mg/dL (ref 40–60)
LDL Calculated: 110 mg/dL — ABNORMAL HIGH (ref <100)
Triglycerides: 128 mg/dL (ref 0–150)
VLDL Cholesterol Calculated: 26 mg/dL

## 2013-02-13 LAB — T4, FREE: T4 Free: 1 ng/ml (ref 0.9–1.8)

## 2013-02-14 LAB — VITAMIN D 25 HYDROXY: Vit D, 25-Hydroxy: 24 ng/mL — ABNORMAL LOW (ref 30–80)

## 2013-03-04 LAB — BASIC METABOLIC PANEL
BUN: 15 mg/dL (ref 7–20)
CO2: 23 mmol/L (ref 21–32)
Calcium: 9.7 mg/dL (ref 8.3–10.6)
Chloride: 106 mmol/L (ref 99–110)
Creatinine: 0.7 mg/dL (ref 0.6–1.1)
GFR African American: >60 (ref >60)
GFR Non-African American: >60 (ref >60)
Glucose: 160 mg/dL — ABNORMAL HIGH (ref 70–99)
Potassium: 4.5 mmol/L (ref 3.5–5.1)
Sodium: 146 mmol/L — ABNORMAL HIGH (ref 136–145)

## 2013-03-27 ENCOUNTER — Emergency Department: Admit: 2013-03-28 | Payer: PRIVATE HEALTH INSURANCE

## 2013-03-27 DIAGNOSIS — R569 Unspecified convulsions: Secondary | ICD-10-CM

## 2013-03-27 MED ORDER — metoprolol tartrate (LOPRESSOR) injection 5 mg
5 | Freq: Once | INTRAVENOUS | Status: AC
Start: 2013-03-27 — End: 2013-03-27
  Administered 2013-03-28: 03:00:00 via INTRAVENOUS

## 2013-03-27 MED ORDER — LORazepam (ATIVAN) 1 MG tablet
1 | ORAL_TABLET | Freq: Three times a day (TID) | ORAL | Status: AC
Start: 2013-03-27 — End: 2013-03-28

## 2013-03-27 MED ORDER — LORazepam (ATIVAN) 2 mg/mL injection
2 | Freq: Once | INTRAMUSCULAR | Status: AC
Start: 2013-03-27 — End: 2013-03-27
  Administered 2013-03-28: 03:00:00 via INTRAVENOUS

## 2013-03-27 MED ORDER — sodium chloride 0.9 % infusion
Freq: Once | INTRAVENOUS | Status: AC
Start: 2013-03-27 — End: 2013-03-28
  Administered 2013-03-28: 03:00:00 via INTRAVENOUS

## 2013-03-27 MED FILL — LORAZEPAM 2 MG/ML INJECTION SOLUTION: 2 2 mg/mL | INTRAMUSCULAR | Qty: 1

## 2013-03-27 MED FILL — SODIUM CHLORIDE 0.9 % INTRAVENOUS SOLUTION: 125.00 125.00 mL/hr | INTRAVENOUS | Qty: 1000

## 2013-03-27 MED FILL — METOPROLOL TARTRATE 5 MG/5 ML INTRAVENOUS SOLUTION: 5 5 mg/5 mL | INTRAVENOUS | Qty: 5

## 2013-03-27 NOTE — Unmapped (Signed)
Seizure, Adult  A seizure is abnormal electrical activity in the brain. Seizures can cause a change in attention or behavior (altered mental status). Seizures often involve uncontrollable shaking (convulsions). Seizures usually last from 30 seconds to 2 minutes. Epilepsy is a brain disorder in which a patient has repeated seizures over time.  CAUSES   There are many different problems that can cause seizures. In some cases, no cause is found. Common causes of seizures include:  ?? Head injuries.  ?? Brain tumors.  ?? Infections.  ?? Imbalance of chemicals in the blood.  ?? Kidney failure or liver failure.  ?? Heart disease.  ?? Drug abuse.  ?? Stroke.  ?? Withdrawal from certain drugs or alcohol.  ?? Birth defects.  ?? Malfunction of a neurosurgical device placed in the brain.  SYMPTOMS   Symptoms vary depending on the part of the brain that is involved. Right before a seizure, you may have a warning (aura) that a seizure is about to occur. An aura may include the following symptoms:   ?? Fear or anxiety.  ?? Nausea.  ?? Feeling like the room is spinning (vertigo).  ?? Vision changes, such as seeing flashing lights or spots.  Common symptoms during a seizure include:  ?? Convulsions.  ?? Drooling.  ?? Rapid eye movements.  ?? Grunting.  ?? Loss of bladder and bowel control.  ?? Bitter taste in the mouth.  After a seizure, you may feel confused and sleepy. You may also have an injury resulting from convulsions during the seizure.  DIAGNOSIS   Your caregiver will perform a physical exam and run some tests to determine the type and cause of your seizure. These tests may include:  ?? Blood tests.  ?? A lumbar puncture test. In this test, a small amount of fluid is removed from the spine and examined.  ?? Electrocardiography (ECG). This test records the electrical activity in your heart.  ?? Imaging tests, such as computed tomography (CT) scans or magnetic resonance imaging (MRI).  ?? Electroencephalography (EEG). This test records the electrical  activity in your brain.  TREATMENT   Seizures usually stop on their own. Treatment will depend on the cause of your seizure. In some cases, medicine may be given to prevent future seizures.  HOME CARE INSTRUCTIONS   ?? If you are given medicines, take them exactly as prescribed by your caregiver.  ?? Keep all follow-up appointments as directed by your caregiver.  ?? Do not swim or drive until your caregiver says it is okay.  ?? Teach friends and family what to do if you have a seizure. They should:  ?? Lay you on the ground to prevent a fall.  ?? Put a cushion under your head.  ?? Loosen any tight clothing around your neck.  ?? Turn you on your side. If vomiting occurs, this helps keep your airway clear.  ?? Stay with you until you recover.  SEEK IMMEDIATE MEDICAL CARE IF:  ?? The seizure lasts longer than 2 to 5 minutes.  ?? The seizure is severe or the person does not wake up after the seizure.  ?? The person has altered mental status.  Drive the person to the emergency department or call your local emergency services (911 in [place]  MAKE SURE YOU:  ?? Understand these instructions.  ?? Will watch your condition.  ?? Will get help right away if you are not doing well or get worse.  Document Released: 07/01/2000 Document Revised: 09/26/2011 Document   Reviewed: 06/22/2011  [employer]?? Patient Information ??2014 [employer]

## 2013-03-27 NOTE — Unmapped (Signed)
per husband pt was shaking on bed, stated more upper body, said eyes rolled back into her head.pt unaware of any previous seizure hx

## 2013-03-27 NOTE — Unmapped (Signed)
Pt to CT

## 2013-03-27 NOTE — Unmapped (Signed)
[facility] ED Note    Date of service:  03/27/2013    Reason for Visit: Seizures      Patient History     HPI Tamara Cox is a 42 year old female who presented to the emergency department with seizure.  The patient stated she went to sleep  this evening while lying in the bed she had a witnessed tonic clonic episode.  She denies any urinary or stool incontinence.  She stated she was having a headache after the seizure.  She denies any speech changes.  She had no vision changes.  She denies any motor component weakness.  She reports no dizziness or lightheadedness.  She denies prior history of seizure.         Past Medical History   Diagnosis Date   ??? Arthritis    ??? Diabetes mellitus    ??? DVT (deep venous thrombosis)    ??? Obesity    ??? PE (pulmonary embolism)        Past Surgical History   Procedure Laterality Date   ??? Hysterectomy     ??? Hernia repair     ??? Knee surgery  right   ??? Tumor excision  right arm   ??? Foot surgery  right   ??? Rotator cuff repair         Patient  reports that she has never smoked. She does not have any smokeless tobacco history on file. She reports that she does not drink alcohol. Her drug history is not on file.      Previous Medications    CELECOXIB (CELEBREX) 200 MG CAPSULE    Take 200 mg by mouth 2 times a day.    CYCLOBENZAPRINE (FLEXERIL) 10 MG TABLET    Take 10 mg by mouth 3 times a day as needed.    DULOXETINE (CYMBALTA) 60 MG CAPSULE    Take 60 mg by mouth daily.    GABAPENTIN (NEURONTIN) 300 MG TABLET    Take 600 mg by mouth 2 times a day.     HYDROCODONE-ACETAMINOPHEN (NORCO) 5-325 MG PER TABLET    Take 1 tablet by mouth every 6 hours as needed for Pain.    LIRAGLUTIDE (VICTOZA 2-PAK) 0.6 MG/0.1 ML (18 MG/3 ML) PNIJ    Inject 1.8 mg subcutaneously daily.    OXYCODONE-ACETAMINOPHEN (PERCOCET) 5-325 MG PER TABLET    Take 1 tablet by mouth every 6 hours as needed for Pain.    RAMIPRIL (ALTACE) 10 MG TABLET    Take 10 mg by mouth daily.        Allergies:   Allergies as of 03/27/2013 - Fully Reviewed 03/27/2013   Allergen Reaction Noted   ??? Penicillins Hives 12/12/2011   ??? Quinolones Hives 12/12/2011   ??? Sulfa (sulfonamide antibiotics) Hives 12/12/2011       Review of Systems     Review of Systems  Constitutional:  Denies fever or chills   Eyes:  Denies change in visual acuity   HENT:  Denies nasal congestion or sore throat   Respiratory:  Denies cough or shortness of breath   Cardiovascular:  Denies chest pain or edema   GI:  Denies abdominal pain, nausea, vomiting, bloody stools or diarrhea   GU:  Denies dysuria   Musculoskeletal:  Denies back pain or joint pain   Integument:  Denies rash   Neurologic:  Denies headache, focal weakness or sensory changes   Endocrine:  Denies polyuria or polydipsia   Lymphatic:  Denies swollen glands  Psychiatric:  Denies depression or anxiety       Physical Exam     ED Triage Vitals   Vital Signs Group      Temp 03/27/13 2153 98.2 ??F (36.8 ??C)      Temp Source 03/27/13 2153 Oral      Heart Rate 03/27/13 2153 88      Heart Rate Source 03/27/13 2153 Monitor      Resp 03/27/13 2153 18      BP 03/27/13 2153 166/118 mmHg      BP Location 03/27/13 2153 Right arm      BP Method 03/27/13 2153 Automatic      Patient Position 03/27/13 2153 Sitting   SpO2 03/27/13 2153 98 %   O2 Device 03/27/13 2153 None (Room air)       ED Physical Exam  Constitutional:  Well developed, well nourished, no acute distress, non-toxic appearance   Eyes:  PERRL, conjunctiva normal   HENT:  Atraumatic, external ears normal, nose normal, oropharynx moist, no pharyngeal exudates. Neck- normal range of motion, no tenderness, supple   Respiratory:  No respiratory distress, normal breath sounds, no rales, no wheezing   Cardiovascular:  Normal rate, normal rhythm, no murmurs, no gallops, no rubs   GI:  Soft, nondistended, normal bowel sounds, nontender, no organomegaly, no mass, no rebound, no guarding   GU:  No costovertebral angle tenderness    Musculoskeletal:  No edema, no tenderness, no deformities. Back- no tenderness  Integument:  Well hydrated, no rash   Lymphatic:  No lymphadenopathy noted   Neurologic:  Alert & oriented x 3, CN 2-12 normal, normal motor function, normal sensory function, no focal deficits noted   Psychiatric:  Speech and behavior appropriate       Diagnostic Studies     Labs:    Please see electronic medical record for any tests performed in the ED    Radiology:    Please see electronic medical record for any tests performed in the ED    EKG:    No EKG Performed    Emergency Department Procedures     Procedures none    ED Course and MDM     Tamara Cox is a 42 y.o. female who presented to the emergency department with Seizures  The patient because of her seizure history and symptoms presented to the emergency department for evaluation for seizure with UA, urine drug screen, renal profile, hepatic and CBC.  The patient because of her symptoms and history will have CT of head for further evaluation of her seizure and symptoms.  Renal profile was normal.  The patient glucose was 346.  LFTs and lipase are normal.  White count was 7 with H and H of 13/40.  Magnesium was normal.  CT head, UA and urine drug screens are pending.  The patient care will be turned over to the oncoming emergency medicine attending for disposition for seizure.          Critical Care Time (Attendings)   None    Final impression is seizure    Disposition is pending        Cherly Anderson, MD  03/27/13 2312

## 2013-03-27 NOTE — Other (Unsigned)
[facility]                                                        [facility] ED Note    Date of service:  03/27/2013    Reason for Visit: Seizures      Patient History    HPI Kathryn Goodman is a 42 year old female who presented to the emergency  department with seizure.  The patient stated she went to sleep  this evening  while lying in the bed she had a witnessed tonic clonic episode.  She denies   any  urinary or stool incontinence.  She stated she was having a headache after the  seizure.  She denies any speech changes.  She had no vision changes.  She   denies  any motor component weakness.  She reports no dizziness or lightheadedness.    She  denies prior history of seizure.      Past Medical History  Diagnosis Date   Arthritis   Diabetes mellitus   DVT (deep venous thrombosis)   Obesity   PE (pulmonary embolism)      Past Surgical History  Procedure Laterality Date   Hysterectomy   Hernia repair   Knee surgery  right   Tumor excision  right arm   Foot surgery  right   Rotator cuff repair      Patient  reports that she has never smoked. She does not have any smokeless  tobacco history on file. She reports that she does not drink alcohol. Her drug  history is not on file.      Previous Medications   CELECOXIB (CELEBREX) 200 MG CAPSULE    Take 200 mg by mouth 2 times a day.   CYCLOBENZAPRINE (FLEXERIL) 10 MG TABLET    Take 10 mg by mouth 3 times a day   as  needed.   DULOXETINE (CYMBALTA) 60 MG CAPSULE    Take 60 mg by mouth daily.   GABAPENTIN (NEURONTIN) 300 MG TABLET    Take 600 mg by mouth 2 times a day.   HYDROCODONE-ACETAMINOPHEN (NORCO) 5-325 MG PER TABLET    Take 1 tablet by   mouth  every 6 hours as needed for Pain.   LIRAGLUTIDE (VICTOZA 2-PAK) 0.6 MG/0.1 ML (18 MG/3 ML) PNIJ    Inject 1.8 mg  subcutaneously daily.   OXYCODONE-ACETAMINOPHEN (PERCOCET) 5-325 MG PER TABLET    Take 1 tablet by  mouth every 6 hours as needed for Pain.   RAMIPRIL (ALTACE) 10 MG TABLET    Take 10 mg by mouth  daily.      Allergies:  Allergies as of 03/27/2013 - Fully Reviewed 03/27/2013  Allergen Reaction Noted   Penicillins Hives 12/12/2011   Quinolones Hives 12/12/2011   Sulfa (sulfonamide antibiotics) Hives 12/12/2011      Review of Systems    Review of Systems  Constitutional:  Denies fever or chills  Eyes:  Denies change in visual acuity  HENT:  Denies nasal congestion or sore throat  Respiratory:  Denies cough or shortness of breath  Cardiovascular:  Denies chest pain or edema  GI:  Denies abdominal pain, nausea, vomiting, bloody stools or diarrhea  GU:  Denies dysuria  Musculoskeletal:  Denies back pain or joint pain  Integument:  Denies rash  Neurologic:  Denies headache, focal weakness or sensory changes  Endocrine:  Denies polyuria or polydipsia  Lymphatic:  Denies swollen glands  Psychiatric:  Denies depression or anxiety      Physical Exam    ED Triage Vitals  Vital Signs Group     Temp 03/27/13 2153 98.2 deg F (36.8 deg C)     Temp Source 03/27/13 2153 Oral     Heart Rate 03/27/13 2153 88     Heart Rate Source 03/27/13 2153 Monitor     Resp 03/27/13 2153 18     BP 03/27/13 2153 166/118 mmHg     BP Location 03/27/13 2153 Right arm     BP Method 03/27/13 2153 Automatic     Patient Position 03/27/13 2153 Sitting  SpO2 03/27/13 2153 98 %  O2 Device 03/27/13 2153 None (Room air)      ED Physical Exam  Constitutional:  Well developed, well nourished, no acute distress, non-toxic  appearance  Eyes:  PERRL, conjunctiva normal  HENT:  Atraumatic, external ears normal, nose normal, oropharynx moist, no  pharyngeal exudates. Neck- normal range of motion, no tenderness, supple  Respiratory:  No respiratory distress, normal breath sounds, no rales, no  wheezing  Cardiovascular:  Normal rate, normal rhythm, no murmurs, no gallops, no rubs  GI:  Soft, nondistended, normal bowel sounds, nontender, no organomegaly, no  mass, no rebound, no guarding  GU:  No costovertebral angle tenderness  Musculoskeletal:  No edema, no  tenderness, no deformities. Back- no tenderness  Integument:  Well hydrated, no rash  Lymphatic:  No lymphadenopathy noted  Neurologic:  Alert & oriented x 3, CN 2-12 normal, normal motor function,   normal  sensory function, no focal deficits noted  Psychiatric:  Speech and behavior appropriate      Diagnostic Studies    Labs:    Please see electronic medical record for any tests performed in the ED    Radiology:    Please see electronic medical record for any tests performed in the ED    EKG:    No EKG Performed    Emergency Department Procedures    Procedures none    ED Course and MDM    Kathryn Goodman is a 42 y.o. female who presented to the emergency department  with Seizures  The patient because of her seizure history and symptoms presented to the  emergency department for evaluation for seizure with UA, urine drug screen,  renal profile, hepatic and CBC.  The patient because of her symptoms and   history  will have CT of head for further evaluation of her seizure and symptoms.  Renal profile was normal.  The patient glucose was 346.  LFTs and lipase are  normal.  White count was 7 with H and H of 13/40.  Magnesium was normal.  CT  head, UA and urine drug screens are pending.  The patient care will be turned  over to the oncoming emergency medicine attending for disposition for seizure.        Critical Care Time (Attendings)  None    Final impression is seizure    Disposition is pending        Cherly AndersonArthur Wall, MD  03/27/13 2312

## 2013-03-27 NOTE — Unmapped (Signed)
Introduced self to pt and family  Offered comfort measures  Pt alert, awake, unlabored, family at bedside, denies further needs at this time, will continue to monitor.

## 2013-03-28 ENCOUNTER — Inpatient Hospital Stay
Admit: 2013-03-28 | Discharge: 2013-03-28 | Disposition: A | Discharge status: Home / Self Care | Payer: PRIVATE HEALTH INSURANCE

## 2013-03-28 LAB — BASIC METABOLIC PANEL
Anion Gap: 14 mmol/L (ref 3–16)
BUN: 14 mg/dL (ref 7–25)
CO2: 27 mmol/L (ref 21–33)
Calcium: 9.4 mg/dL (ref 8.6–10.2)
Chloride: 100 mmol/L (ref 98–110)
Creatinine: 0.68 mg/dL (ref 0.50–1.20)
GFR MDRD Af Amer: 114 See note.
GFR MDRD Non Af Amer: 94 See note.
Glucose: 346 mg/dL (ref 65–99)
Osmolality, Calculated: 306 mOsm/kg (ref 278–305)
Potassium: 4.3 mmol/L (ref 3.5–5.3)
Sodium: 141 mmol/L (ref 135–146)

## 2013-03-28 LAB — PROTIME-INR
INR: 0.9 (ref 0.9–1.1)
Protime: 12.4 s (ref 11.6–14.4)

## 2013-03-28 LAB — CBC
Hematocrit: 40.2 % (ref 35.0–45.0)
Hemoglobin: 13.2 g/dL (ref 11.7–15.5)
MCH: 28.3 pg (ref 27.0–33.0)
MCHC: 32.9 g/dL (ref 32.0–36.0)
MCV: 86.2 fL (ref 80.0–100.0)
MPV: 9.7 fL (ref 7.5–11.5)
Platelets: 201 10*3/uL (ref 140–400)
RBC: 4.66 10*6/uL (ref 3.80–5.10)
RDW: 14.3 % (ref 11.0–15.0)
WBC: 7.7 10*3/uL (ref 3.8–10.8)

## 2013-03-28 LAB — URINE DRUG SCREEN WITHOUT CONFIRMATION, STAT
Amphetamines UR, 1000 ng/mL Cutoff: NEGATIVE
Barbiturates UR, 300  ng/mL Cutoff: NEGATIVE
Benzodiazepines UR, 300 ng/mL Cutoff: NEGATIVE
Cocaine UR, 300 ng/mL Cutoff: NEGATIVE
MDMA URINE: NEGATIVE
Methadone, UR, 300 ng/mL Cutoff: NEGATIVE
Methamph, UR, 1000 ng/mL Cutoff: NEGATIVE
Opiates UR, 300 ng/mL Cutoff: NEGATIVE
Oxycodone UR: NEGATIVE
Phencyclidine (PCP) UR, 25 ng/mL Cutoff: NEGATIVE
THC UR, 50 ng/mL Cutoff: NEGATIVE
Tricyclic Antidepressants Screen,Urine 1000 ng/mL Cutoff: NEGATIVE

## 2013-03-28 LAB — URINALYSIS W/RFL TO MICROSCOPIC
Bilirubin, UA: NEGATIVE
Blood, UA: NEGATIVE
Glucose, UA: >=500 mg/dL — AB
Ketones, UA: NEGATIVE mg/dL
Leukocyte Esterase, UA: NEGATIVE
Nitrite, UA: NEGATIVE
Protein, UA: NEGATIVE mg/dL
RBC, UA: 1 /HPF (ref 0–3)
Specific Gravity, UA: 1.016 (ref 1.005–1.035)
Squam Epithel, UA: 1 /HPF (ref 0–5)
Urobilinogen, UA: <2 mg/dL (ref 0.2–1.9)
WBC, UA: 1 /HPF (ref 0–5)
pH, UA: 6 (ref 5.0–8.0)

## 2013-03-28 LAB — DIFFERENTIAL
Basophils Absolute: 39 /uL (ref 0–200)
Basophils Relative: 0.5 % (ref 0.0–1.0)
Eosinophils Absolute: 162 /uL (ref 15–500)
Eosinophils Relative: 2.1 % (ref 0.0–8.0)
Lymphocytes Absolute: 2202 /uL (ref 850–3900)
Lymphocytes Relative: 28.6 % (ref 15.0–45.0)
Monocytes Absolute: 578 /uL (ref 200–950)
Monocytes Relative: 7.5 % (ref 0.0–12.0)
Neutrophils Absolute: 4720 /uL (ref 1500–7800)
Neutrophils Relative: 61.3 % (ref 40.0–80.0)

## 2013-03-28 LAB — HEPATIC FUNCTION PANEL
ALT: 27 U/L (ref 13–69)
AST: 26 U/L (ref 14–65)
Albumin: 4.5 g/dL (ref 3.6–5.1)
Alkaline Phosphatase: 105 U/L (ref 33–115)
Bilirubin, Direct: 0 mg/dL (ref 0.0–0.2)
Total Bilirubin: 0.7 mg/dL (ref 0.2–1.2)
Total Protein: 7.5 g/dL (ref 6.2–8.3)

## 2013-03-28 LAB — LIPASE: Lipase: 89 U/L (ref 23–300)

## 2013-03-28 LAB — APTT: aPTT: 19.4 seconds (ref 24.3–33.1)

## 2013-03-28 LAB — HCG URINE, QUALITATIVE: Preg Test, Ur: NEGATIVE

## 2013-03-28 LAB — MAGNESIUM: Magnesium: 2.1 mg/dL (ref 1.5–2.5)

## 2013-03-28 MED ORDER — prochlorperazine (COMPAZINE) injection Soln 10 mg
10 | Freq: Once | INTRAMUSCULAR | Status: AC
Start: 2013-03-28 — End: 2013-03-28
  Administered 2013-03-28: 06:00:00 via INTRAVENOUS

## 2013-03-28 MED ORDER — LORazepam (ATIVAN) 1 MG tablet
1 | ORAL_TABLET | Freq: Three times a day (TID) | ORAL | Status: AC
Start: 2013-03-28 — End: 2013-03-31

## 2013-03-28 MED FILL — PROCHLORPERAZINE EDISYLATE 10 MG/2 ML (5 MG/ML) INJECTION SOLUTION: 10 10 mg/2 mL (5 mg/mL) | INTRAMUSCULAR | Qty: 2

## 2013-03-28 NOTE — Unmapped (Signed)
[facility] ED  Reassessment Note    Tamara Cox is a 42 y.o. female who presented to the emergency department on 03/27/2013. This patient was initially seen by an off-going provider and their care has been turned over to me. Please see the original provider's note for details regarding the initial history, physical exam and ED course.  At the time of turnover the following steps in the patient's evaluation were pending:     Urinalysis, which was completed and showed elevated glucose but otherwise grossly normal, urine drug screen which was grossly negative, and a CT scan of the head without contrast shows no acute abnormality.  The results were discussed the patient should be discharged home asked for the previously established plan and will followup with neurology and her primary care physician as an outpatient.        Clinical Impression:    1. Seizure

## 2013-03-28 NOTE — Unmapped (Signed)
Pt alert, awake, unlabored, family at bedside, denies further needs at this time, will continue to monitor.

## 2013-03-28 NOTE — Unmapped (Signed)
Pt alert, awake, unlabored. Discharge instructions given, pt verbalized understanding. Pt discharged home.

## 2013-04-10 ENCOUNTER — Ambulatory Visit: Admit: 2013-04-10 | Payer: PRIVATE HEALTH INSURANCE | Attending: Neurology

## 2013-04-10 DIAGNOSIS — R569 Unspecified convulsions: Secondary | ICD-10-CM

## 2013-04-10 NOTE — Unmapped (Signed)
1. No change in medicines today  2. Schedule MRI of the brain and EEG  3. Pursue sleep study to evaluate for sleep apnea

## 2013-04-10 NOTE — Unmapped (Signed)
Subjective:      Patient ID: Tamara Cox is a 42 y.o. female.    HPI   Tamara Cox came to clinic today to discuss a possible seizure.    She has been under stress lately and had a severe headache while working at home on the computer. She decided to take a break, then lay down on the couch on her right side. She was still speaking with her husband when she suddenly stop speaking and the next thing that she recalls was her husband giving her her shoes and saying that they were going to the hospital because she had a seizure. She apparently had some convulsive activity lasting 30 seconds. During the event her eyes rolled back and her mouth was open as though she were gasping for air. When she returned to consciousness she was a little dizzy and slightly disoriented but she was able to answer questions though she felt a little foggy. She was seen at [facility] emergency room where her blood pressure was markedly elevated in 166/118. During the event she did not bite her tongue and did not lose control of her bladder.    Immediately prior to the episode of lost consciousness and convulsive activity she noticed that her ears popped repeatedly. She had a severe headache at the time and she continued to experience a headache after the seizure.    Prior to this event she had not been sleeping very well since she had been under more stress. She generally only sleeps five or six hours of disrupted sleep per night. She snores but it is not clear if she has sleep apnea. She is scheduled to have a sleep study performed.    There is no known family history of epilepsy. Her 74 year old son, however, had two febrile seizures around age 89. He did not require medications and does not take any medications to prevent seizures now. She denies any previous history of meningitis or encephalitis. She has never had significant enough head trauma to cause seizures.     She reports fairly frequent headaches, with head pain at least  weekly. The headaches are often pounding and severe and associated with nausea.  She only started to experience these head pains over the past three weeks with an increase in stress.         Histories:     She has a past medical history of Arthritis; Diabetes mellitus; DVT (deep venous thrombosis); Obesity; PE (pulmonary embolism); and Seizures.    She has past surgical history that includes Hysterectomy; Hernia repair; Knee surgery (right); Tumor excision (right arm); Foot surgery (right); and Rotator cuff repair.    Her family history is not on file.    She reports that she has never smoked. She does not have any smokeless tobacco history on file. She reports that she does not drink alcohol.      Review of Systems   Constitutional: Positive for fatigue. Negative for fever, chills, activity change, appetite change and unexpected weight change.   HENT: Negative for hearing loss, neck pain, neck stiffness and tinnitus.    Eyes: Positive for visual disturbance. Negative for photophobia, pain and redness.   Respiratory: Negative for cough, shortness of breath and wheezing.    Cardiovascular: Negative for chest pain, palpitations and leg swelling.   Gastrointestinal: Positive for nausea. Negative for vomiting, abdominal pain, diarrhea and constipation.   Genitourinary: Negative for dysuria and hematuria.   Musculoskeletal: Negative for back pain and arthralgias.  Skin: Negative for color change and rash.   Neurological: Positive for headaches. Negative for dizziness, light-headedness and numbness.   Hematological: Negative for adenopathy. Does not bruise/bleed easily.   Psychiatric/Behavioral: Positive for sleep disturbance. Negative for behavioral problems, dysphoric mood and agitation.       Allergies:   Penicillins; Quinolones; and Sulfa (sulfonamide antibiotics)    Medications:     Outpatient Encounter Prescriptions as of 04/10/2013   Medication Sig Dispense Refill   ??? celecoxib (CELEBREX) 200 MG capsule Take 200 mg  by mouth 2 times a day.       ??? cyclobenzaprine (FLEXERIL) 10 MG tablet Take 10 mg by mouth 3 times a day as needed.       ??? DULoxetine (CYMBALTA) 60 MG capsule Take 60 mg by mouth daily.       ??? gabapentin (NEURONTIN) 300 MG tablet Take 600 mg by mouth 2 times a day.        ??? HYDROcodone-acetaminophen (NORCO) 5-325 mg per tablet Take 1 tablet by mouth every 6 hours as needed for Pain.  20 tablet  0   ??? liraglutide (VICTOZA 2-PAK) 0.6 mg/0.1 mL (18 mg/3 mL) PnIj Inject 1.8 mg subcutaneously daily.       ??? oxyCODONE-acetaminophen (PERCOCET) 5-325 mg per tablet Take 1 tablet by mouth every 6 hours as needed for Pain.  12 tablet  0   ??? ramipril (ALTACE) 10 MG tablet Take 10 mg by mouth daily.         No facility-administered encounter medications on file as of 04/10/2013.        Objective:       Blood pressure 128/72, pulse 80, height 5' 7 (1.702 m), weight 325 lb (147.419 kg).    Neurologic Exam     Mental Status   Oriented to person, place, and time.       Attention: normal. Concentration: normal.   Speech: speech is normal   Level of consciousness: alert    Cranial Nerves      CN II   Visual fields full to confrontation.      CN III, IV, VI   Pupils are equal, round, and reactive to light.  Extraocular motions are normal.   Right pupil: Size: 3 mm. Shape: regular. Reactivity: brisk.   Left pupil: Size: 3 mm. Shape: regular. Reactivity: brisk.   CN III: no CN III palsy  CN VI: no CN VI palsy  Nystagmus: none   Ophthalmoparesis: none     CN VII   Right facial weakness: none  Left facial weakness: none     CN VIII   Hearing: intact     CN IX, X   Palate: symmetric     CN XI   Right sternocleidomastoid strength: normal  Left sternocleidomastoid strength: normal  Right trapezius strength: normal  Left trapezius strength: normal     CN XII   Tongue: not atrophic  Fasciculations: absent  Tongue deviation: none    Motor Exam   Muscle bulk: normal  Overall muscle tone: normal  Right arm pronator drift: absent  Left arm  pronator drift: absent     Strength   Strength 5/5 throughout.     Sensory Exam   Light touch normal.     Gait, Coordination, and Reflexes      Gait  Gait: normal     Coordination   Finger to nose coordination: normal     Tremor   Resting tremor: absent  Intention tremor:  absent  Action tremor: absent     Reflexes   Right brachioradialis: 2+  Left brachioradialis: 2+  Right biceps: 2+  Left biceps: 2+  Right triceps: 2+  Left triceps: 2+  Right patellar: 2+  Left patellar: 2+  Right achilles: 2+  Left achilles: 2+      Physical Exam   Constitutional: She is oriented to person, place, and time. She appears well-developed and well-nourished.   HENT:   Head: Normocephalic and atraumatic.   Eyes: Conjunctivae and EOM are normal. Pupils are equal, round, and reactive to light.   Neck: Normal range of motion. No thyromegaly present.   Cardiovascular: Normal rate, regular rhythm and normal heart sounds.  Exam reveals no gallop and no friction rub.    No murmur heard.  Pulmonary/Chest: Effort normal and breath sounds normal. No respiratory distress. She has no wheezes. She has no rales.   Abdominal: She exhibits no distension.   Musculoskeletal: Normal range of motion. She exhibits no edema.   Lymphadenopathy:     She has no cervical adenopathy.   Neurological: She is oriented to person, place, and time. She has normal strength. She has a normal Finger-Nose-Finger Test. Gait normal.   Reflex Scores:       Tricep reflexes are 2+ on the right side and 2+ on the left side.       Bicep reflexes are 2+ on the right side and 2+ on the left side.       Brachioradialis reflexes are 2+ on the right side and 2+ on the left side.       Patellar reflexes are 2+ on the right side and 2+ on the left side.       Achilles reflexes are 2+ on the right side and 2+ on the left side.  Skin: Skin is warm. No rash noted. No erythema. No pallor.   Psychiatric: She has a normal mood and affect. Her speech is normal and behavior is normal.        Prior Diagnostic Testing:   MRI:   EEG:        Assessment:     Tamara Cox has:  1. Recent seizure - she had a recent convulsive event of unclear origin, but her blood pressure was markedly elevated when she was seen in the emergency room. It is possible that her very high blood pressure was responsible for this event. Her neurological exam currently is unremarkable and a CT scan of the head was also unremarkable following this episode. I would like to evaluate her with an MRI of the brain and an EEG to see what her risk of future seizures might be, though. I do not think that it would be a good idea for her to begin treatment with an antiepileptic medication to prevent seizures at this time since she has only had a single event and there is a higher risk of side effects than potential benefit. Given the characteristics of this events and the circumstances I also do not feel that she needs to restrict driving.  2. Possible obstructive sleep apnea syndrome - she will be evaluated with a home sleep study in the near future.  3. Headaches - she has a strong family history of migraine and she reports fairly severe throbbing headaches that are sometimes associated with nausea over the past three weeks. We talked about the possibility of trying a prophylactic medication after she has the MRI and EEG. She will think about this issue  and let me know if she wishes to make any changes. A magnesium level was obtained while she was in the emergency room and it was normal at 2.1.    Plan:     1. No change in medications today  2. I agree with sleep study to evaluate for possible obstructive sleep apnea syndrome  3. Schedule MRI of the brain with and without contrast and routine EEG to evaluate her future risk of seizures  4. Return to clinic to be arranged after the studies are completed  5. Consider Topamax, zonisamide, or Keppra to prevent headaches in future

## 2013-04-19 NOTE — Unmapped (Signed)
Called and LM for pt to call the office.

## 2013-04-19 NOTE — Unmapped (Signed)
Spoke with patient. Stated she has been dizzy for the past week. Only happens when she stands and walks. No changes with her medications. B/P has been fine. Yesterday was 118/80. Please respond back to the clinic pool for any additional follow up.

## 2013-04-19 NOTE — Unmapped (Signed)
Ask her to check her blood pressure and heart rate when she is standing and walking and feeling the symptoms.  It sounds like her blood pressure is too low when she stands and walks around.

## 2013-04-22 NOTE — Unmapped (Signed)
The patient is calling back with BP readings. Her sitting BP was 140/93 and standing was 124/85, FYI. She states the dizziness is still occuring upon standing only, but it has improved since last week, FYI.

## 2013-04-22 NOTE — Unmapped (Signed)
Called and LM for pt to call the office.

## 2013-04-22 NOTE — Unmapped (Signed)
Patient notified via voicemail.

## 2013-04-22 NOTE — Unmapped (Signed)
OK, blood pressure falls enough when standing to cause the symptoms.  Drink plenty of fluids to help solve the problem

## 2013-04-22 NOTE — Unmapped (Signed)
Spoke with patient. Relayed message from doctor. Patient will check BP when standing and walking and call office back.

## 2013-04-25 ENCOUNTER — Inpatient Hospital Stay: Admit: 2013-04-25 | Payer: PRIVATE HEALTH INSURANCE | Attending: Neurology

## 2013-04-25 DIAGNOSIS — R569 Unspecified convulsions: Secondary | ICD-10-CM

## 2013-04-25 MED ORDER — MAGNEVIST (gadopentetate dimeglumine) 20 mL
469.01 | Freq: Once | INTRAVENOUS | Status: AC | PRN
Start: 2013-04-25 — End: 2013-04-25
  Administered 2013-04-25: 16:00:00 via INTRAVENOUS

## 2013-04-25 NOTE — Unmapped (Signed)
Procedures  [facility]                    PATIENT NAME: Tamara Cox, Tamara Cox   MR#: [id number]  DATE OF BIRTH:   04-17-1971        CSN#: [id number]  ATTENDING:   Aneta Mins A. Garris Melhorn, M. D.      ADMIT DATE: 04/25/2013  DICTATED BY: Charlestine Night. Eshaal Duby, M.D.  DATE OF EEG: 04/25/2013                 ELECTROENCEPHALOGRAM        PROCEDURE PERFORMED:  Routine Electroencephalogram      CLINICAL INFORMATION:  This is an evaluation of a 42 year old woman for possible seizures    MEDICATIONS:  See chart    TECHNICAL SUMMARY:  This EEG was performed with a multi-channel Grass Digital EEG System.  In addition to the standard 10-20 electrode array additional channels monitored ECG and eye movement activity.  The data was recorded digitally.      There is a symmetrical and reactive 9 Hz occipital dominant rhythm.  Low voltage 18-22 Hz activity was seen maximally in the anterior regions.        Photic Stimulation:  No abnormalities noted with various frequencies of flickering light  Hyperventilation:  No abnormalities noted during three minutes of over-breathing  Sleep:  No sleep was seen      IMPRESSION:  This patient???s routine EEG during the waking and drowsy state showed no focal, lateralizing or epileptiform features and is within the range of normal variation.  No spindle stage sleep was seen.        Rod Holler  04/25/2013

## 2013-04-26 NOTE — Unmapped (Signed)
Message copied by March Rummage on Fri Apr 26, 2013  8:23 AM  ------       Message from: WHITE, PHILLIP A       Created: Thu Apr 25, 2013  1:07 PM         MRI and EEG are both normal  ------

## 2013-04-26 NOTE — Unmapped (Signed)
Message copied by March Rummage on Fri Apr 26, 2013 12:20 PM  ------       Message from: WHITE, PHILLIP A       Created: Thu Apr 25, 2013 10:22 AM       Regarding: FW: EEG - out pt         Please let her know that the EEG was normal              Thanks       ----- Message -----          From: Berton Mount          Sent: 04/25/2013   9:58 AM            To: Henrene Pastor, MD       Subject: EEG - out pt                                               Routine EEG ready to be interpreted.                     New onset seizure         ------

## 2013-04-26 NOTE — Unmapped (Signed)
Patient notified.

## 2013-04-26 NOTE — Unmapped (Signed)
Called patient LMOV advised of normal MRI and EEG and if any further questions to call the office back [phone].

## 2013-06-19 LAB — T4, FREE: T4 Free: 1.1 ng/ml (ref 0.9–1.8)

## 2013-06-19 LAB — TSH: TSH: 2.72 u[IU]/mL (ref 0.27–4.20)

## 2013-07-03 NOTE — Progress Notes (Signed)
[address] [place], [place] [address]    ACKNOWLEDGMENT OF INFORMED CONSENT FOR SURGICAL OR MEDICAL PROCEDURE AND SEDATION  I agree to allow doctor(s) MICHAEL L SWANK and his/her associates or assistants, including residents and/or other qualified medical practitioner to perform the following medical treatment or procedure and to administer or direct the administration of sedation as necessary:    Name of Procedure(s) : RIGHT TOTAL KNEE ARTHROPLASTY    My doctor has explained the following regarding the proposed procedure:  ??? the explanation of the procedure  ??? the benefits of the procedure  ??? the potential problems that might occur during recuperation  ??? the risks and side effects of the procedure which could include but are not limited to severe blood loss, infection, stroke or death  ??? the benefits, risks and side effect of alternative procedures including the consequences of declining this procedure or any alternative procedures  ??? the likelihood of achieving satisfactory results.    I acknowledge no guarantee or assurance has been made to me regarding the results.    I understand that during the course of this treatment/procedure, unforeseen conditions can occur which require an additional or different procedure.  I agree to allow my physician or assistants to perform such extension of the original procedure as they may find necessary.    I understand that sedation will often result in temporary impairment of memory and fine motor skills and that sedation can occasionally progress to a state of deep sedation or general anesthesia.    I understand the risks of anesthesia for surgery include, but are not limited to, sore throat, hoarseness, injury to face, mouth, or teeth; nausea; headache; injury to blood vessels or nerves; death, brain damage, or paralysis.    I understand that if I have a Limitation of Treatment order in effect during my hospitalization, the order may or may not be in  effect during this procedure.     I give my doctor permission to give me blood or blood products.  I understand that there are risks with receiving blood such as hepatitis, AIDS, fever, or allergic reaction.  I acknowledge that the risks, benefits, and alternatives of this treatment have been explained to me and that no express or implied warranty has been given by the hospital, any blood bank, or any person or entity as to the blood or blood components transfused.    At the discretion of my doctor, I agree to allow observers, equipment/product representatives and allow photographing, and/or televising of the procedure, provided my name or identity is maintained confidentially.    I agree the hospital may dispose of or use for scientific or educational purposes any tissue, fluid, or body parts which may be removed.    _____________________________________Date__________Time________ AM/PM  (Circle One)  Patient or Signature of Closest Relative or Legal Guardian    _____________________________________Date__________Time_________AM/PM  Witness

## 2013-07-03 NOTE — Discharge Instructions (Signed)
DISCHARGE TOTAL KNEE INSTRUCTIONS    1. Ok to shower if no wound drainage  2. Daily dressing changes and clean around area with alcohol. Ok to leave open to air if no drainage after a few days.  If there are 2 small incisions on the lower leg, clean those with alcohol and cover with band aids.If the wound drains call the office.  3. Walk 5 minutes every hour while awake.  4. If you do not remember your follow-up appt call the office. [phone]  5. Call if temperatures  >101  6. Driving: check with MD first  7. Stairs: as instructed by PT.  8. Regarding blood sugar if diabetic-Injections of Steroids may cause a temporary increase. Monitor frequently over the next several days. Contact MD who treats your Diabetes if there is a significant increase above your normal range.  9. Use ice packs as directed  10. Take your xarelto  daily as directed

## 2013-07-04 NOTE — Progress Notes (Signed)
The following educational items and goals will be achieved upon completion of the patient's Pre-admission testing appointment:             Identify the learner who is being assessed for education:  Patient                       Ability to Learn:  Exhibits ability to grasp concepts and respond to questions: High  Ready to Learn: Yes  calm   Preferred Method of Learning:  written  Barriers to Learning: Verbalizes interest  Special Considerations due to cultural, religious, spiritual beliefs:  No  Language:  English  Language Interpreter:  No    [place]  [x]  Appropriate evaluation / integration of data as delineated by ASPAN Standards of Perianesthesia Nursing Practice    Pain scale and pain management   [x] Patient verbalizes understanding of pain scale and pain management  [x] Pre-operative determination of patient???s anticipated Post-Operative pain goal:   less than 4 of 10 on 10 point scale post op goal  []  Other     Medication(s) - Compliance with preop medication instructions  [x]  Patient verbalizes understanding of preop medications (see [facility] Presurgical Instructions)    Instructions, Pre op                                                                                            [x]  Patient verbalizes understanding of presurgical instructions as reviewed with phone interview nurse or in-person nurse review    Fall Risk Potential, Preoperatively                                                                                   [x] No preoperative risk identified  [] Preop risk identified:                    [] Sensory deficit        [] Motor deficit        [] Balance problem        [] Home medication        [] Uses assistive device                    [] History of a Fall within the last 30 days    Goal(s) for fall prevention:  [] Prevent fall or injury by requesting assistance with activities of daily living  [] Patient / Significant other verbalizes understanding the need to  call for assistance prior to getting out of bed during hospitalization      Infection Precautions                                                                                            [  x] Patient understands implementation of Surgical Site Infection precautions (see [facility] Presurgical Instructions)     Patient Safety  [x]  Patient identification verified  [x]  Site verified    Instructions - Discharge Planning for Outpatients  [x]  Patient / significant other voices understanding of home care and follow up procedures  [x]  Encourage patient / significant other to review discharge instructions the day after procedure due to sedation on day of surgery    Anticipated Special Needs upon discharge:        []  Cooling device        []  Crutches       []  Lang        []  Wound Support device        []  Drain        []  Other       Instructions - Discharge Planning for Admitted patients  [x]  Patient / significant other understands plan for admission after surgery  []  Patient / significant other understands plan for anticipated discharge dispostion        07/04/2013 10:02 AM Jannifer Hick

## 2013-07-04 NOTE — Anesthesia Pre-Procedure Evaluation (Signed)
ANESTHESIA PRE-OP NOTE    NAME: Kathryn Goodman  DOB: 1970-10-24  AGE: 42 y.o.  MED. REC. #: [id number]  DOS: 07/05/13   TIME: 12:30     PROCEDURE: Right Total Knee Arthroplasty  SURGEON: Swank     ALLERGIES: Alpha blocker quinazolines; Codeine; Penicillins; and Sulfa antibiotics Height: 5\' 7"  (170.2 cm) Weight: 329 lb (149.233 kg)   MEDICATIONS:   ALTACE  CYMBALTA  SYNTHROID  INSULIN  ACTOS  NEURONTIN  PREVACID  ALLEGRA  CELEBREX ASA STATUS: 3  NPO since: check day of surgery    Patient identified/chart reviewed: YES     PSH:  Temporomandibular joint surgery; Hysterectomy; Abdomen surgery; Knee arthroscopy; Incisional hernia repair; Incisional hernia repair; and Toe Closed Reduction (02/04/2010).    PERSONAL/FAMILY ANESTHESIA PROBLEMS: ( - )       CV: HTN; EKG-->SINUS RHYTHM PULMONARY: H/O PULMONARY EMBOLUS (2011); ALLERGIC RHINITIS    GI/HEPATIC: GERD  ENDOCRINE: DM (INSTRUCTED TO TAKE 1/2 OF HER USUAL INSULIN EVENING BEFORE); H/O HYPOTHYROIDISM   NEURO/PSYCH: MIGRAINES; 1 SEIZURE (CONVULSION) IN SEPT. 2014-->NEGATIVE WORKUP, ATTRIBUTED TO 'STRESS' GU: BUN / CREATININE-->NORMAL   MUSCULOSKELETAL: PER DR. [facility] OTHER: MORBID OBESITY; HYPERLIPIDEMIA   HEME/ONC: ( - )  COMMENTS: patient is a singer and requested 'small' endotracheal tube-->I told her we would used the smallest consistent with safety     AIRWAY ASSESSMENT: (MORBID OBESITY / S/P TMJ SURGERY-->experiences popping, but  no locking)  MALLAMPATI: 3 DENTITION: INTACT ROM: FULL     ANESTHETIC PLAN: GENERAL with IV Induction   CONSENT: Risks/benefits/options/questions discussed. Patient agrees:  Sabino Snipes, M.D. July 04, 2013  10:25 AM

## 2013-07-04 NOTE — Progress Notes (Signed)
[facility] PRE-SURGICAL TESTING INSTRUCTIONS     Date of Procedure 07/05/13 Time of Procedure per Dr. Mendel Corning.    PRIOR TO PROCEDURE DATE:  1. Arrange for someone to take you home and be with you after discharge since you cannot drive after receiving sedation. Please ensure it is someone we can share information with regarding your discharge.    2. Contact your doctor for advice if:       a. You are taking any blood thinners, aspirin, anti-inflammatory or vitamin E products.       b. There is a change in your physical state such as a cold, fever, rash, cuts, sores or infection, especially near your surgical site.    3. Do not drink alcohol the day before your surgery.    4. Please follow guidelines prior to surgery for diet, medication, or preparations as advised by your doctor.    5. FOLLOW INSTRUCTIONS FOR ARRIVAL TIME AS DIRECTED BY YOUR DOCTOR.  If your doctor does not give you a specific arrival time, please arrive at:  Per Dr. Mendel Corning.    6.  If there is no one at the front desk when you arrive, please call SDS at [phone] to  let us know you have arrived.  You will not need to register again.    THE DAY OF YOUR PROCEDURE:  1. On the day of surgery, please report to the registration desk at the MAIN entrance.  Follow the signs to valet parking to get to the MAIN entrance.    2. DO NOT EAT OR DRINK ANYTHING AFTER MIDNIGHT. The only exception would be a small sip of water with any medications you were told to take the morning of surgery.         a. Medication instructions for the day of surgery: Take Prevacid and any meds as directed by Dr. Mendel Corning.         b. Diabetic medication instructions for the night before and the day of surgery: 1/2 usual dose of Lantus evening before surgery, NO diabetic meds morning of surgery.    3. Do not swallow water when brushing teeth. No gum, candy, mints or ice chips. Refrain from smoking or at least decrease the amount.    4. Dress in loose, comfortable clothing appropriate for  redressing after your procedure.  Do not wear jewelry, make-up,especially NO eye make-up, fingernail polish, lotion, powders or metal hairclips.    5. Dentures, glasses, or contacts may need to be removed before surgery. Bring a case for your glasses, contacts, dentures or hearing aids.  If you use a CPAP, please bring it with you the day of surgery.    6. Leave any valuables at home such as credit cards, cash, cell phones and jewelry. The hospital will not be responsible for valuables that are not secured in the hospital safe.    7. You may bring a bag with personal items if you are to spend the night. Please have any large items you may need brought in by your family after your arrival to your hospital room.    8. Please bring a copy of any history and physical form, or medical reports your doctor may have given you.    9. If you have a Living Will or Durable Power of Attorney, please bring a copy on the day of surgery.    HOW WE KEEP YOU SAFE and WORK TO PREVENT SURGICAL SITE INFECTIONS:  1. Health care workers should always check  your ID bracelet to verify your name and birth date. You will be asked many times to state your name, date of birth, and allergies.    2. Health care workers should always clean their hands with soap or alcohol gel before providing care to you. It is okay to ask anyone if they cleaned their hands before they touch you.    3. You will be actively involved in verifying the type of surgery you are having and ensuring the correct surgical site is confirmed.    4. Do not shave near where you will have surgery. Shaving with a razor can irritate your skin and make it easier to develop an infection. On the day of your procedure, any hair that needs to be removed near the surgical site will be ???clipped??? by a Research scientist (physical sciences) using a special clippers designed to avoid skin irritation.    5. When you are in the operating room, your surgical site will be cleansed with a special soap and in most  cases you will be given an antibiotic before the surgery begins.    AFTER YOUR PROCEDURE:  1. For comfort and safety, arrange to have someone at home with you for the first 24 hours after discharge.    2. You and your family will be given written instructions about your diet, activity, dressing care, medications, and return visits.    3. Always clean your hands before and after caring for your wound.    4. Mild nausea, headache, muscle aches, sore throat, or fatigue may occur after anesthesia. Should any of these symptoms become severe, or should you notice any signs of infection, you should call your doctor.    5. Narcotic pain medications can cause significant constipation.  You may want to add a stool softener to your postoperative medication schedule or speak to your surgeon on how best to manage this SIDE EFFECT.    SPECIAL INSTRUCTIONS     ADDITIONAL INFORMATION REVIEWED:  Yes Taking Control of Your Pain   Yes FAQs about ???Surgical Site Infections ???  Yes Your Guide to Knee Replacement Surgery. Please bring this booklet back on the day of your surgery.  Yes Same Day Services Booklet   Yes Hibiclens?? Bathing Instructions       Thank you for allowing [place] to care for you.  We strive to exceed your expectations in the overall delivery of care and service to you and your family.    If you need to contact us for any reason, please call us at [phone]  [facility].07/04/2013 .10:04 AM    Instructions reviewed and copy given to patient during visit.

## 2013-07-05 ENCOUNTER — Inpatient Hospital Stay
Admit: 2013-07-05 | Disposition: A | Discharge status: Home / Self Care | Source: Ambulatory Visit | Attending: Orthopaedic Surgery | Admitting: Orthopaedic Surgery

## 2013-07-05 DIAGNOSIS — M1710 Unilateral primary osteoarthritis, unspecified knee: Secondary | ICD-10-CM

## 2013-07-05 LAB — POCT GLUCOSE
POC Glucose: 118 mg/dL — ABNORMAL HIGH (ref 65–99)
POC Glucose: 197 mg/dL — ABNORMAL HIGH (ref 65–99)
POC Glucose: 199 mg/dL — ABNORMAL HIGH (ref 65–99)

## 2013-07-05 MED ORDER — MORPHINE SULFATE (PF) 2 MG/ML IV SOLN
2 | INTRAVENOUS | Status: DC | PRN
Start: 2013-07-05 — End: 2013-07-05

## 2013-07-05 MED ORDER — OXYCODONE-ACETAMINOPHEN 5-325 MG PO TABS
5-325 | ORAL | Status: DC | PRN
Start: 2013-07-05 — End: 2013-07-05

## 2013-07-05 MED ORDER — FENTANYL CITRATE 0.05 MG/ML IJ SOLN
0.05 | INTRAMUSCULAR | Status: DC | PRN
Start: 2013-07-05 — End: 2013-07-05
  Administered 2013-07-05 (×2): 50 ug via INTRAVENOUS

## 2013-07-05 MED ORDER — INSULIN LISPRO 100 UNIT/ML SC SOLN
100 UNIT/ML | Freq: Every evening | SUBCUTANEOUS | Status: DC
Start: 2013-07-05 — End: 2013-07-06

## 2013-07-05 MED ORDER — ACETAMINOPHEN 325 MG PO TABS
325 | ORAL | Status: DC | PRN
Start: 2013-07-05 — End: 2013-07-06

## 2013-07-05 MED ORDER — DIPHENHYDRAMINE HCL 50 MG/ML IJ SOLN
50 | Freq: Once | INTRAMUSCULAR | Status: DC | PRN
Start: 2013-07-05 — End: 2013-07-05

## 2013-07-05 MED ORDER — MORPHINE SULFATE (PF) 2 MG/ML IV SOLN
2 | INTRAVENOUS | Status: DC | PRN
Start: 2013-07-05 — End: 2013-07-06
  Administered 2013-07-06: 15:00:00 2 mg via INTRAVENOUS

## 2013-07-05 MED ORDER — INSULIN GLARGINE 100 UNIT/ML SC SOLN
100 UNIT/ML | Freq: Every evening | SUBCUTANEOUS | Status: DC
Start: 2013-07-05 — End: 2013-07-06
  Administered 2013-07-06: 05:00:00 via SUBCUTANEOUS

## 2013-07-05 MED ORDER — METOCLOPRAMIDE HCL 5 MG/ML IJ SOLN
5 | Freq: Once | INTRAMUSCULAR | Status: DC | PRN
Start: 2013-07-05 — End: 2013-07-05

## 2013-07-05 MED ORDER — OXYCODONE HCL 5 MG PO TABS
5 | ORAL | Status: DC | PRN
Start: 2013-07-05 — End: 2013-07-06

## 2013-07-05 MED ORDER — LIDOCAINE HCL (PF) 1 % IJ SOLN
1 | Freq: Once | INTRAMUSCULAR | Status: DC | PRN
Start: 2013-07-05 — End: 2013-07-05

## 2013-07-05 MED ORDER — INSULIN LISPRO 100 UNIT/ML SC SOLN
100 UNIT/ML | Freq: Three times a day (TID) | SUBCUTANEOUS | Status: DC
Start: 2013-07-05 — End: 2013-07-06
  Administered 2013-07-06 (×3): via SUBCUTANEOUS

## 2013-07-05 MED ADMIN — HYDROmorphone HCl PF (DILAUDID) injection SOLN 1 mg: 1 mg | INTRAVENOUS | @ 21:00:00 | NDC 00409128331

## 2013-07-05 MED ADMIN — ceFAZolin (ANCEF) 3 g in dextrose 5 % 100 mL IVPB: INTRAVENOUS | @ 18:00:00 | NDC 63323023710

## 2013-07-05 MED ADMIN — celecoxib (CELEBREX) capsule 200 mg: ORAL | NDC 00025152534

## 2013-07-05 MED ADMIN — lactated ringers infusion: INTRAVENOUS | @ 16:00:00 | NDC 00409795309

## 2013-07-05 MED ADMIN — glycopyrrolate (ROBINUL) injection 0.2 mg: INTRAVENOUS | @ 17:00:00 | NDC 00517460125

## 2013-07-05 MED ADMIN — LORazepam (ATIVAN) injection 0.5 mg: INTRAVENOUS | @ 20:00:00 | NDC 00641604401

## 2013-07-05 MED ADMIN — lactated ringers infusion: INTRAVENOUS | @ 21:00:00 | NDC 00409795309

## 2013-07-05 MED ADMIN — oxyCODONE HCl ER CR tablet 20 mg: ORAL | @ 15:00:00 | NDC 59011042020

## 2013-07-05 MED ADMIN — ramipril (ALTACE) capsule 5 mg: ORAL | NDC 00054010820

## 2013-07-05 MED ADMIN — Dexamethasone Sodium Phosphate injection 10 mg: INTRAVENOUS | @ 23:00:00 | NDC 63323016505

## 2013-07-05 MED ADMIN — dexamethasone (DECADRON) injection 10 mg: INTRAVENOUS | @ 16:00:00 | NDC 63323016501

## 2013-07-05 MED ADMIN — midazolam (VERSED) injection 2 mg: INTRAVENOUS | @ 16:00:00 | NDC 00409230517

## 2013-07-05 MED ADMIN — famotidine (PEPCID) injection 20 mg: INTRAVENOUS | @ 16:00:00 | NDC 63323073912

## 2013-07-05 MED ADMIN — metoclopramide (REGLAN) injection 10 mg: INTRAVENOUS | @ 17:00:00 | NDC 00409341401

## 2013-07-05 MED FILL — RAMIPRIL 5 MG PO CAPS: 5 MG | ORAL | Qty: 1

## 2013-07-05 MED FILL — CEFAZOLIN 2000 MG IN D5W 100 ML IVPB: Qty: 2

## 2013-07-05 MED FILL — MORPHINE SULFATE 10 MG/ML IJ SOLN: 10 MG/ML | INTRAMUSCULAR | Qty: 0.5

## 2013-07-05 MED FILL — XARELTO 10 MG PO TABS: 10 MG | ORAL | Qty: 1

## 2013-07-05 MED FILL — HUMALOG 100 UNIT/ML SC SOLN: 100 UNIT/ML | SUBCUTANEOUS | Qty: 3

## 2013-07-05 MED FILL — FAMOTIDINE 20 MG/2ML IV SOLN: 20 MG/2ML | INTRAVENOUS | Qty: 2

## 2013-07-05 MED FILL — LORAZEPAM 2 MG/ML IJ SOLN: 2 MG/ML | INTRAMUSCULAR | Qty: 1

## 2013-07-05 MED FILL — MIDAZOLAM HCL 2 MG/2ML IJ SOLN: 2 MG/ML | INTRAMUSCULAR | Qty: 2

## 2013-07-05 MED FILL — HYDROMORPHONE HCL PF 1 MG/ML IJ SOLN: 1 MG/ML | INTRAMUSCULAR | Qty: 1

## 2013-07-05 MED FILL — FENTANYL CITRATE 0.05 MG/ML IJ SOLN: 0.05 MG/ML | INTRAMUSCULAR | Qty: 2

## 2013-07-05 MED FILL — CEFAZOLIN SODIUM 1 G IJ SOLR: 1 g | INTRAMUSCULAR | Qty: 3000

## 2013-07-05 MED FILL — GLYCOPYRROLATE 0.2 MG/ML IJ SOLN: 0.2 MG/ML | INTRAMUSCULAR | Qty: 1

## 2013-07-05 MED FILL — OXYCONTIN 20 MG PO T12A: 20 MG | ORAL | Qty: 1

## 2013-07-05 MED FILL — LANTUS 100 UNIT/ML SC SOLN: 100 UNIT/ML | SUBCUTANEOUS | Qty: 10

## 2013-07-05 MED FILL — DEXAMETHASONE SODIUM PHOSPHATE 4 MG/ML IJ SOLN: 4 MG/ML | INTRAMUSCULAR | Qty: 3

## 2013-07-05 MED FILL — CYKLOKAPRON 100 MG/ML IV SOLN: 100 MG/ML | INTRAVENOUS | Qty: 10

## 2013-07-05 MED FILL — CELEBREX 200 MG PO CAPS: 200 MG | ORAL | Qty: 1

## 2013-07-05 MED FILL — DEXAMETHASONE SODIUM PHOSPHATE 20 MG/5ML IJ SOLN: 20 MG/5ML | INTRAMUSCULAR | Qty: 5

## 2013-07-05 MED FILL — METOCLOPRAMIDE HCL 5 MG/ML IJ SOLN: 5 MG/ML | INTRAMUSCULAR | Qty: 2

## 2013-07-05 NOTE — Plan of Care (Signed)
[facility] PACU Education and Care Plan Goals  The following items will be achieved upon completion of the patient's transfer or discharge from the PACU:    Post Operative Pain Management                                                                               _0  Patient will verbalize understanding of pain scale and pain management.  _1  Patient achieves predetermined pain goal of 4.  _2  Self reports a comfort level acceptable for discharge to in patient floor.  _3  Other     Fall Risk Potential  _4  Due to Perioperative medication administration  Additional Risk Identified:   _5  Sensory deficit         _6  Motor deficit         _7  Balance problem         _8  Home medication         _9  Uses assistive device to ambulate    Goal(s) for fall prevention:  _10  Prevent fall or injury by calling for assistance with activity and use of siderails while hospitalized  _11  Prevent fall or injury by using assistance with activity after discharge.  _12  Patient / Significant other verbalize understanding in use of any ordered assistive devices    Mobility Safety/ ADL  _13  Reach a functional mobility goal within limitations of the procedure.    Infection Precautions                                                                                                            _14 Patient understands implementation of infection precautions (see [facility] Presurgical Instructions and SSI Prevention Handout)    Post operative Assessment and Care                                                             _15  Standards of care met as delineated by ASPAN.                                                              Discharge Education and Goals  _16 Patient voices understanding of PACU discharge criteria  _17 Outpatient / significant other voices understanding of home care and follow up procedures (See [facility] Procedure Discharge Instructions)  _18  Patient / significant other understanding of Special Needs:  _19  Cooling  device  _20   Wound Support Device  []  Crutches   [] Drain    []  [place]   [x] Other - Ice packs to right knee/SCD's/TED BK to LLE  [x]  Inpatient / significant other understands the plan for transfer to the inpatient unit

## 2013-07-05 NOTE — Progress Notes (Signed)
Patient admitted to PACU # 13 from OR at 1457 post right TKR per Dr. Mendel CorningSwank.  Attached to PACU monitoring system and report received from CRNA.  Patient was reportedly hemodynamically stable during surgical procedure.  Patient arrived in PACU complaining of pain in her right surgical knee.  Titrating pain medication as ordered.

## 2013-07-05 NOTE — Op Note (Signed)
PATIENT NAME:                 PA #:            MR #Kathryn Goodman, Kathryn Goodman                 [id number]       [id number]            SURGEON:                              SURG DATE:  DIS DATE:          Rosemarie Beath, MD                   07/05/2013                     PRIMARY CARE PHYSICIAN:               REFERRING PHYSICIAN:            Wynema Birch, MD                                                   DATE OF BIRTH:   AGE:           PATIENT TYPE:     RM #:              Jul 03, 1971       42             IPJ               [id number]                  S PREOPERATIVE DIAGNOSIS(ES):  Osteoarthritis of right knee.     POSTOPERATIVE DIAGNOSIS(ES):  Osteoarthritis of right knee.     PROCEDURE(S) PERFORMED:  Right total knee arthroplasty.       SURGEON:  Casimiro Needle L. Mendel Corning, M.D.          ASSISTANT:  Liliane Bade, PA      COMPONENTS USED:  DePuy Attune CRRP knee 6 femur, 6 tibia, 7 polyethylene,  and 35 patella.        It should be noted that surgery was performed with [employer] computer system  custom cutting guides.       INDICATION FOR PROCEDURE:  The patient presents for elective total knee  arthroplasty, having failed adequate nonoperative treatment.  The risks,  benefits, and treatment alternatives were explained to the patient, including  but not limited to infection, bleeding, blood clots, implant failure,  loosening, prosthetic wear, failure to relieve pain, need for further  surgery, and the attendant risks of anesthesia and surgical blood loss  including the possible need for transfusion.  The patient stated they  understood, agreed with the surgical plan, and wished to proceed.     DETAILS OF PROCEDURE:  The patient was placed supine on the operative table  after the adequate induction of anesthesia.  The leg was prepped and draped  in a sterile fashion and the tourniquet inflated.  An anterior incision was  made.  Dissection was made exposing the femur, patella, and tibial  plateau.   The tibia was resected using  TruMatch computer-assisted custom cutting  guides.  The femur was resected using TruMatch computer-assisted custom  cutting guides.  The patella was resurfaced with a free-hand technique.   Pre-resection thickness was 24 mm.  Post resection thickness was 15 mm.   Appropriate flexion and extension spaces and appropriate ligament balance was  achieved.  Intraoperative radiograph was not taken with the trial components  in position.  After determining the appropriate sized polyethylene component,  the components were cemented in position after appropriate preparation of the  bone surfaces and cement mixing.  The components were impacted into position  and held while the cement cured.  All loose cement debris was removed.   Intraoperative patellar tracking was assessed and a lateral release was not  performed.  The tourniquet was deflated, bleeding stopped with  electrocautery, a drain was placed, and the wound was closed in layers.  A  sterile dressing was applied to the wound.       The plan is for a routine postoperative course.                                             Rosemarie Beath, MD     [id number]  DD: 07/05/2013 14:18  DT: 07/05/2013 19:15  Job #: [id number]  CC: Rosemarie Beath, MD  CC: Wynema Birch, MD

## 2013-07-05 NOTE — Plan of Care (Signed)
The following education and goals will be achieved upon completion of the patient's care in SDS:    IdIdentify the learner who is being assessed for education: Patient  Ability to Learn:  Exhibits ability to grasp concepts and respond to questions: High  Ready to Learn: Yes  anxious  Preferred Method of Learning:  verbal  Barriers to Learning: Verbalizes interest  Special Considerations due to cultural, religious, spiritual beliefs:  no  Language: English  Language Interpreter: no    [facility] Perioperative Care Plan Goals  [x] Appropriate evaluation / integration of data as delineated by ASPAN Standards of Perianesthesia Nursing Practice.    Pain scale and pain management  [x] Patient will verbalize understanding of pain scale and pain management.  [x] Pre-operative determination of patient???s anticipated Post-Operative pain goal: 4 of 10 on 10 point scale post op goal  [x] Patient will verbalize plan for pain management  [] Other     Compliance with Pre-op Instructions - Patient reports compliance with:  [x] Taking prescribed home meds before arrival  [x] Surgical prep instructions specific to the patient's surgery    Fall Risk - PreOperatively   [] No preoperative risk identified  [x] Preop risk identified:      []  Sensory deficit     []  Motor deficit     []  Balance problem     []  Home medication     [] Uses assistive device to ambulate      [] History of a Fall within the last 30 days  [x] Due to Perioperative medication administration    Goal(s) for fall prevention:  [x]  Prevent fall or injury by use of encouraging call light for assistance, side rails, and assisting with activity.  [x] Patient / Significant other verbalize understanding of need to call for assistance prior to getting out of bed.    Infection Precautions                                                                                                   [x]  Patient understands implementation of [facility] Site Infection precautions  [x]  Handwashing, skin  prep prior to IV insertion, hair clipped at surgical site if needed  [x]  Pre op antibiotic, if ordered    Patient Safety  [x]  Patient identification  [x]  Site verification (See Universal Protocol Checklist)  [x]  General Safety precautions  [x]  Side rails up, bed/stretcher low position and wheels locked  [x]  Call light within reach  [x]  Patient instructed to call for assistance prior to getting out of bed    Instructions - Discharge planning for Outpatients  [x]  Patient / significant other voices understanding of home care and follow up procedures.  Anticipated Special Needs upon discharge:  []  Cooling device  []  Crutches  []  [place]  [x]  Wound Support device   [] Drain  [] Other   []  See [facility] Procedure Discharge Instructions    Instructions - Discharge planning for Admitted Patients  []  Patient / Significant other understands plan for admission after surgery  [x]  Patient / Significant other understands plan for anticipated discharge disposition  07/05/2013 10:10 AM

## 2013-07-05 NOTE — Consults (Signed)
Hospital Medicine  Consult History & Physical        Chief Complaint: hypoxia    Date of Service: Pt seen/examined in consultation on 07/05/2013 at 11:40PM    History Of Present Illness:      The patient is a 42 y.o. female who we are asked to see/evaluate by Dr. Mendel Corning for peri-operative mgt of right TKR. Pain well controlled, ambulating, tol po without nausea, vomiting. Denies chest pain, shortness of breath.  Pt has hx of PE post surgery in 2011.         Past Medical History:        Diagnosis Date   ??? Thyroid disease    ??? Diabetes mellitus (HCC)    ??? Hyperlipidemia    ??? Hypertension    ??? Migraines    ??? Convulsion (HCC) 03/2013     x 1 episode, negative workup   ??? Pulmonary emboli (HCC)      2 P.E.'s post foot surgery 2011   ??? Arthritis      knees, hips   ??? GERD (gastroesophageal reflux disease)    ??? Obesity        Past Surgical History:        Procedure Laterality Date   ??? Temporomandibular joint surgery     ??? Hysterectomy     ??? Abdomen surgery       post hysterectomy infection   ??? Knee arthroscopy       right   ??? Incisional hernia repair     ??? Incisional hernia repair       repeat hernia repair   ??? Toe closed reduction  02/04/2010     OPEN REDUCTION INTERNAL FIXATION RIGHT 5TH METATARSAL   ??? Other surgical history Right 07/05/2013     RIGHT TOTAL KNEE ARTHROPLASTY                Medications Prior to Admission:    Prior to Admission medications    Medication Sig Start Date End Date Taking? Authorizing Provider   ramipril (ALTACE) 5 MG capsule Take 5 mg by mouth daily.   Yes Historical Provider, MD   DULoxetine (CYMBALTA) 30 MG capsule Take 30 mg by mouth daily.   Yes Historical Provider, MD   levothyroxine (SYNTHROID) 100 MCG tablet Take 100 mcg by mouth Daily.   Yes Historical Provider, MD   insulin lispro (HUMALOG) 100 UNIT/ML injection cartridge Inject  into the skin 3 times daily (before meals). Per sliding scale   Yes Historical Provider, MD   insulin glargine (LANTUS) 100 UNIT/ML injection vial Inject  25 Units into the skin nightly.   Yes Historical Provider, MD   pioglitazone (ACTOS) 30 MG tablet Take 30 mg by mouth daily.   Yes Historical Provider, MD   gabapentin (NEURONTIN) 300 MG capsule Take 300 mg by mouth 3 times daily as needed.   Yes Historical Provider, MD   lansoprazole (PREVACID) 30 MG capsule Take 30 mg by mouth daily.   Yes Historical Provider, MD   fexofenadine (ALLEGRA) 60 MG tablet Take 60 mg by mouth as needed.     Yes Historical Provider, MD   celecoxib (CELEBREX) 200 MG capsule Take 200 mg by mouth daily.     Yes Historical Provider, MD       Allergies:  Alpha blocker quinazolines; Codeine; Penicillins; and Sulfa antibiotics    Social History:  The patient currently lives with family    TOBACCO:   reports that she has never  smoked. She does not have any smokeless tobacco history on file.  ETOH:   reports that she does not drink alcohol.      Family History:  Reviewed in detail and negative for DM, Early CAD, Cancer, CVA. Positive as follows:    Grandparents with diabetes, cancer.    REVIEW OF SYSTEMS:   Positive for as noted in the HPI.  All other systems reviewed and negative.    PHYSICAL EXAM:  BP 131/85   Pulse 105   Temp(Src) 98.5 ??F (36.9 ??C) (Oral)   Resp 18   Ht 5\' 7"  (1.702 m)   Wt 329 lb (149.233 kg)   BMI 51.52 kg/m2   SpO2 90%    General appearance: No apparent distress, appears stated age and cooperative.  HEENT Normal cephalic, atraumatic without obvious deformity. Pupils equal, round, and reactive to light.  Extra ocular muscles intact. Conjunctivae/corneas clear.  Neck: Supple, No jugular venous distention/bruits. Trachea midline without thyromegaly or adenopathy with full range of motion.  Lungs: Clear to ascultation, bilaterally without Rales/Wheezes/Rhonchi with good respiratory effort.  Heart: Regular rate and rhythm with Normal S1/S2 without  murmurs, rubs or gallops, point of maximum impulse non-displaced  Abdomen: Soft, non-tender or non-distended without rigidity or  guarding and positive bowel sounds all four quadrants.  Extremities: No clubbing, cyanosis, or edema bilaterally. Right knee wrapped, good pulses, sensation, ROM in right foot  Skin: Skin color, texture, turgor normal.  No rashes or lesions.  Neurologic: Alert and oriented X 3,  neurovascularly intact with sensory/motor intact upper extremities/lower extremities, bilaterally. Cranial nerves:II-XII intact, grossly non-focal.  Mental status: Alert, oriented, thought content appropriate.    CBC   No results found for this basename: WBC, HGB, HCT, PLT,  in the last 72 hours   RENAL  No results found for this basename: NA, K, CL, CO2, PHOS, BUN, CREATININE, CA,  in the last 72 hours  LFT'S  No results found for this basename: AST, ALT, ALB, BILIDIR, BILITOT, ALKPHOS,  in the last 72 hours  COAG  No results found for this basename: INR,  in the last 72 hours  CARDIAC ENZYMES  No results found for this basename: CKTOTAL, CKMB, CKMBINDEX, TROPONINI,  in the last 72 hours    U/A:    No results found for this basename: NITRITE, COLORU, WBCUA, RBCUA, MUCUS, BACTERIA, CLARITYU, SPECGRAV, LEUKOCYTESUR, BLOODU, GLUCOSEU, KETONESU, AMORPHOUS       ABG    No results found for this basename: HCO3ART, BEART, O2SATART, PHART, THGBART, PCO2ART, PO2ART, TCO2ART           Active Hospital Problems    Diagnosis Date Noted   ??? Primary localized osteoarthrosis, lower leg [715.16] 07/05/2013       ASSESSMENT/PLAN:    1. Hypoxia - post op atelectasis, resolved, cont IS use    2. HTN - improving control, cont home regimen    3. Right knee replacement - post op mgmt per ortho including pain control, activity, DVT prophylaxis    4. Diabetes - uncontrolled due to steroids. Cont lantus, sliding scale insulin    DVT Prophylaxis: xarelto per ortho  Diet: DIET CARB CONTROL;  Code Status: Full Code  PT/OT Eval Status: per ortho    Dispo - inpt, ok to discharge from medical standpoint, will sign off.        Kelvin Burpee Carolanne Grumbling, MD

## 2013-07-05 NOTE — Progress Notes (Signed)
Ancef to OR

## 2013-07-05 NOTE — Anesthesia Post-Procedure Evaluation (Signed)
Anesthesia Post-op Note    Name: Kathryn Goodman   DOB: [birth year]    MRN: [id number]   Procedure(s) Performed: Right total knee arthroplasty    Date of Surgery: 07/05/13  Anesthesia type: General      POST-OP ASSESSMENT::    ?? Anesthetic problems: No  ?? Vitals (most recent):  height is 5\' 7"  (1.702 m) and weight is 329 lb (149.233 kg). Her oral temperature is 98.6 ??F (37 ??C). Her blood pressure is 142/88 and her pulse is 106. Her respiration is 16 and oxygen saturation is 93%.   ?? Cardiovascular system stable: Yes  ?? Respiratory function:  airway patent: Yes  ventilator and/or ETT: No  ?? Hydration adequate: Yes    ?? Nausea: No  ?? Level of consciousness: awake, alert  and oriented  ?? Post-op pain control: Adequate  ?? Other:    assessment completed and signed @:   [facility]. Effie Shy, MD  July 05, 2013 7:15 PM

## 2013-07-05 NOTE — Brief Op Note (Signed)
Brief Postoperative Note    Kathryn Goodman  Date of Birth:  1970-09-21  [id number]    Pre-operative Diagnosis: osteoarthritis right knee    Post-operative Diagnosis: Same    Procedure: right total knee replacement, trumatch custom cutting guides    Anesthesia: General    Surgeons/Assistants: Rosea Dory/grote    Estimated Blood Loss: 100     Complications: None    Specimens: Was Not Obtained    Findings: same as preop    Electronically signed by Tildon Husky, MD on 07/05/2013 at 2:22 PM

## 2013-07-05 NOTE — Plan of Care (Signed)
Problem: Pain - Acute:  Goal: Pain level will decrease  Pain level will decrease  positve dorsi flex. 5 of 10 aware of pain goal.

## 2013-07-05 NOTE — Progress Notes (Signed)
PACU Transfer Note    Filed Vitals:    07/05/13 1700   BP: 163/80   Pulse: 103   Temp:    Resp: 11       In: 200 [I.V.:200]  Out: -     Pain assessment:   Pain Level: 4    Report given to Receiving unit RN.    07/05/2013 5:32 PM

## 2013-07-05 NOTE — H&P (Signed)
07/05/2013   Orthopedic H & P Update                    (  < 30 days since last complete H & P )                   Patient Kathryn Goodman/ 07/05/2013                         Diagnosis: Increasing / worsening & evolving, non-traumatic Osteoarthritic Pain, despite  more conservative modalities, including  oral analgesics / ( RIGHT knee joint  ).                         HPI / Manifestations: Increasing articulation pain of deteriorated ( RIGHT knee joint  ).      Diagnosis # 2: Chronic HA syndrome-    Diagnosis # 3: DM ( type I ).         PMHX:   Past Medical History   Diagnosis Date   ??? Thyroid disease    ??? Diabetes mellitus (HCC)    ??? Hyperlipidemia    ??? Hypertension    ??? Migraines    ??? Convulsion (HCC) 03/2013     x 1 episode, negative workup   ??? Pulmonary emboli (HCC)      2 P.E.'s post foot surgery 2011   ??? Arthritis      knees, hips   ??? GERD (gastroesophageal reflux disease)    ??? Obesity         PSHX:   Past Surgical History   Procedure Laterality Date   ??? Temporomandibular joint surgery     ??? Hysterectomy     ??? Abdomen surgery       post hysterectomy infection   ??? Knee arthroscopy       right   ??? Incisional hernia repair     ??? Incisional hernia repair       repeat hernia repair   ??? Toe closed reduction  02/04/2010     OPEN REDUCTION INTERNAL FIXATION RIGHT 5TH METATARSAL       Social Hx:  reports that she has never smoked. She does not have any smokeless tobacco history on file. She reports that she does not drink alcohol or use illicit drugs.    Occupational Impact:                         Home Meds:   Prior to Admission medications    Medication Sig Start Date End Date Taking? Authorizing Provider   ramipril (ALTACE) 5 MG capsule Take 5 mg by mouth daily.   Yes Historical Provider, MD   levothyroxine (SYNTHROID) 100 MCG tablet Take 100 mcg by mouth Daily.   Yes Historical Provider, MD   insulin glargine (LANTUS) 100 UNIT/ML injection vial Inject 25 Units into the skin nightly.   Yes Historical Provider, MD    lansoprazole (PREVACID) 30 MG capsule Take 30 mg by mouth daily.   Yes Historical Provider, MD   DULoxetine (CYMBALTA) 30 MG capsule Take 30 mg by mouth daily.    Historical Provider, MD   insulin lispro (HUMALOG) 100 UNIT/ML injection cartridge Inject  into the skin 3 times daily (before meals). Per sliding scale    Historical Provider, MD   pioglitazone (ACTOS) 30 MG tablet Take 30 mg by mouth daily.  Historical Provider, MD   gabapentin (NEURONTIN) 300 MG capsule Take 300 mg by mouth 3 times daily as needed.    Historical Provider, MD   fexofenadine (ALLEGRA) 60 MG tablet Take 60 mg by mouth as needed.      Historical Provider, MD   celecoxib (CELEBREX) 200 MG capsule Take 200 mg by mouth daily.      Historical Provider, MD                          Allergies: Alpha blocker quinazolines; Codeine; Penicillins; and Sulfa antibiotics                         Exam: NAD                      VS-  BP 150/105   Pulse 69   Temp(Src) 98.4 ??F (36.9 ??C) (Oral)   Resp 18   Ht 5\' 7"  (1.702 m)   Wt 329 lb (149.233 kg)   BMI 51.52 kg/m2   SpO2 96%              General:  Alert and aware of today's  Pending procedure.                  Heent: Atraumatic   / no acute changes.                   Neck: Supple, midline trachaea, normal swallowing observed.                   C V:  RRR, no Murmurs.             Lungs: CTA, No wheezes, Ronchi, or Crackles.                    Abdomen:  + BS, Soft, ND, NT and no appliances.      Skin: Warm / Dry. No skin tears.                      Extremities: Symmetrical, no edema, palpable bilateral distal pulses.                  Neurologic: Pupils =, round, clear voice and patient expresses understanding of questions / pending procedure.                       Today's labs: CBC:   Lab Results   Component Value Date    WBC 5.6 02/12/2013    RBC 4.60 02/12/2013    HGB 13.3 02/12/2013    HCT 39.9 02/12/2013    MCV 86.7 02/12/2013    MCH 28.8 02/12/2013    MCHC 33.2 02/12/2013    RDW 15.3 02/12/2013    PLT 179  02/12/2013    MPV 11.2 02/12/2013     CMP:    Lab Results   Component Value Date    NA 146 03/04/2013    K 4.5 03/04/2013    CL 106 03/04/2013    CO2 23 03/04/2013    BUN 15 03/04/2013    CREATININE 0.7 03/04/2013    GFRAA >60 03/04/2013    GFRAA >60 10/28/2011    AGRATIO 2.0 02/12/2013    LABGLOM >60 03/04/2013    GLUCOSE 160 03/04/2013    PROT 6.3 02/12/2013    PROT 6.7 05/06/2011  LABALBU 4.2 02/12/2013    CALCIUM 9.7 03/04/2013    BILITOT 0.3 02/12/2013    ALKPHOS 105 02/12/2013    AST 12 02/12/2013    ALT 14 02/12/2013                    Assessment: 42 y.o. Hypertensive,  female, non-smoker,  with worsening Osteoarthritic pain / < ing  ROM, painful  ( joint extension / flexion ), rotational  and  weight-bearing pain @ ( RIGHT knee joint  ).              Plan: ( RIGHT  ) ( Knee ) ( Arthroplasty  ) procedure, today.      - Rehabilitation as directed by surgeon / therapist-    Consults for PT / OT / Social services and Internal Medicine may - / can be requested by surgical services.              Comment: Continue Health Care Coordination / Maintenance / Monitoring, Including (  Injury / re-injury prevention activities ) with  patient's PCP - Advised.                 Cain Sieve, PA

## 2013-07-06 LAB — POCT GLUCOSE
POC Glucose: 222 mg/dL — ABNORMAL HIGH (ref 65–99)
POC Glucose: 222 mg/dL — ABNORMAL HIGH (ref 65–99)
POC Glucose: 228 mg/dL — ABNORMAL HIGH (ref 65–99)

## 2013-07-06 MED ORDER — OXYCODONE HCL 5 MG PO TABS
5 MG | ORAL_TABLET | ORAL | Status: AC
Start: 2013-07-06 — End: 2013-07-13

## 2013-07-06 MED ORDER — DULOXETINE HCL 30 MG PO CPEP
30 MG | ORAL_CAPSULE | Freq: Every evening | ORAL | Status: AC
Start: 2013-07-06 — End: ?

## 2013-07-06 MED ORDER — RIVAROXABAN 10 MG PO TABS
10 MG | ORAL_TABLET | ORAL | Status: AC
Start: 2013-07-06 — End: ?

## 2013-07-06 MED ADMIN — sodium chloride flush 0.9 % injection 10 mL: INTRAVENOUS | @ 15:00:00

## 2013-07-06 MED ADMIN — docusate sodium (COLACE) capsule 100 mg: ORAL | @ 14:00:00 | NDC 62584068311

## 2013-07-06 MED ADMIN — Dexamethasone Sodium Phosphate injection 10 mg: INTRAVENOUS | @ 06:00:00 | NDC 63323016505

## 2013-07-06 MED ADMIN — gabapentin (NEURONTIN) capsule 300 mg: ORAL | @ 03:00:00 | NDC 68084076211

## 2013-07-06 MED ADMIN — oxyCODONE HCl ER CR tablet 10 mg: ORAL | @ 14:00:00 | NDC 59011041020

## 2013-07-06 MED ADMIN — Dexamethasone Sodium Phosphate injection 5 mg: INTRAVENOUS | @ 12:00:00 | NDC 63323016505

## 2013-07-06 MED ADMIN — docusate sodium (COLACE) capsule 100 mg: ORAL | @ 03:00:00 | NDC 62584068311

## 2013-07-06 MED ADMIN — Dexamethasone Sodium Phosphate injection 5 mg: INTRAVENOUS | @ 18:00:00 | NDC 63323016505

## 2013-07-06 MED ADMIN — ceFAZolin (ANCEF) 2 g in dextrose 5% 100 mL IVPB: INTRAVENOUS | @ 08:00:00 | NDC 09999990046

## 2013-07-06 MED ADMIN — LORazepam (ATIVAN) tablet 0.5 mg: ORAL | @ 06:00:00 | NDC 68084073611

## 2013-07-06 MED ADMIN — DULoxetine (CYMBALTA) capsule 30 mg: ORAL | @ 14:00:00 | NDC 68084068311

## 2013-07-06 MED ADMIN — ceFAZolin (ANCEF) 2 g in dextrose 5% 100 mL IVPB: INTRAVENOUS | @ 03:00:00 | NDC 09999990046

## 2013-07-06 MED ADMIN — levothyroxine (SYNTHROID) tablet 100 mcg: ORAL | @ 11:00:00 | NDC 00074662411

## 2013-07-06 MED ADMIN — ramipril (ALTACE) capsule 5 mg: ORAL | @ 14:00:00 | NDC 00054010820

## 2013-07-06 MED ADMIN — oxyCODONE HCl ER CR tablet 10 mg: ORAL | @ 06:00:00 | NDC 59011041020

## 2013-07-06 MED ADMIN — celecoxib (CELEBREX) capsule 200 mg: ORAL | @ 14:00:00 | NDC 00025152534

## 2013-07-06 MED ADMIN — pantoprazole (PROTONIX) tablet 40 mg: ORAL | @ 11:00:00 | NDC 68084065711

## 2013-07-06 MED ADMIN — rivaroxaban (XARELTO) tablet 10 mg: ORAL | @ 06:00:00 | NDC 50458058010

## 2013-07-06 MED ADMIN — oxyCODONE (ROXICODONE) immediate release tablet 10 mg: 10 mg | ORAL | @ 03:00:00 | NDC 68084035411

## 2013-07-06 MED ADMIN — oxyCODONE (ROXICODONE) immediate release tablet 10 mg: 10 mg | ORAL | @ 17:00:00 | NDC 00406055223

## 2013-07-06 MED ADMIN — oxyCODONE (ROXICODONE) immediate release tablet 10 mg: 10 mg | ORAL | @ 06:00:00 | NDC 00406055223

## 2013-07-06 MED ADMIN — oxyCODONE (ROXICODONE) immediate release tablet 10 mg: 10 mg | ORAL | @ 11:00:00 | NDC 00406055223

## 2013-07-06 MED ADMIN — morphine (PF) injection 4 mg: 4 mg | INTRAVENOUS | @ 03:00:00 | NDC 00409189101

## 2013-07-06 MED ADMIN — morphine (PF) injection 4 mg: 4 mg | INTRAVENOUS | @ 08:00:00 | NDC 00409189101

## 2013-07-06 MED ADMIN — morphine (PF) injection 4 mg: 4 mg | INTRAVENOUS | NDC 00409189101

## 2013-07-06 MED FILL — LORAZEPAM 0.5 MG PO TABS: 0.5 MG | ORAL | Qty: 1

## 2013-07-06 MED FILL — MORPHINE SULFATE (PF) 4 MG/ML IV SOLN: 4 mg/mL | INTRAVENOUS | Qty: 1

## 2013-07-06 MED FILL — ANECTINE 20 MG/ML IJ SOLN: 20 MG/ML | INTRAMUSCULAR | Qty: 10

## 2013-07-06 MED FILL — XARELTO 10 MG PO TABS: 10 MG | ORAL | Qty: 1

## 2013-07-06 MED FILL — PANTOPRAZOLE SODIUM 40 MG PO TBEC: 40 MG | ORAL | Qty: 1

## 2013-07-06 MED FILL — CYMBALTA 30 MG PO CPEP: 30 MG | ORAL | Qty: 1

## 2013-07-06 MED FILL — PROPOFOL 10 MG/ML IV EMUL: 10 MG/ML | INTRAVENOUS | Qty: 20

## 2013-07-06 MED FILL — LIDOCAINE HCL (CARDIAC) 20 MG/ML IV SOLN: 20 MG/ML | INTRAVENOUS | Qty: 5

## 2013-07-06 MED FILL — OXYCODONE HCL 5 MG PO TABS: 5 MG | ORAL | Qty: 2

## 2013-07-06 MED FILL — LEVOTHYROXINE SODIUM 100 MCG PO TABS: 100 MCG | ORAL | Qty: 1

## 2013-07-06 MED FILL — DOCUSATE SODIUM 100 MG PO CAPS: 100 MG | ORAL | Qty: 1

## 2013-07-06 MED FILL — ONDANSETRON HCL 4 MG/2ML IJ SOLN: 4 MG/2ML | INTRAMUSCULAR | Qty: 2

## 2013-07-06 MED FILL — RAMIPRIL 5 MG PO CAPS: 5 MG | ORAL | Qty: 1

## 2013-07-06 MED FILL — ZEMURON 50 MG/5ML IV SOLN: 50 MG/5ML | INTRAVENOUS | Qty: 5

## 2013-07-06 MED FILL — LABETALOL HCL 5 MG/ML IV SOLN: 5 MG/ML | INTRAVENOUS | Qty: 4

## 2013-07-06 MED FILL — DIPHENHYDRAMINE HCL 50 MG/ML IJ SOLN: 50 MG/ML | INTRAMUSCULAR | Qty: 1

## 2013-07-06 MED FILL — OXYCONTIN 10 MG PO T12A: 10 MG | ORAL | Qty: 1

## 2013-07-06 MED FILL — GABAPENTIN 300 MG PO CAPS: 300 MG | ORAL | Qty: 1

## 2013-07-06 MED FILL — CELEBREX 200 MG PO CAPS: 200 MG | ORAL | Qty: 1

## 2013-07-06 MED FILL — DEXAMETHASONE SODIUM PHOSPHATE 20 MG/5ML IJ SOLN: 20 MG/5ML | INTRAMUSCULAR | Qty: 5

## 2013-07-06 MED FILL — MORPHINE SULFATE (PF) 2 MG/ML IV SOLN: 2 mg/mL | INTRAVENOUS | Qty: 1

## 2013-07-06 NOTE — Care Coordination-Inpatient (Signed)
Received referral per Dr. Mendel Corning re: d/c home today with Home PT only.  Chart reviewed and met with Pt at bedside to discuss.  Pt is agreement with plan and no preference in Home Care agency.  Pt agrees with referral to [facility].  SW contacted [facility] with referral and faxed face sheet, orders, H&P, and AVS.  SW intervention is complete.

## 2013-07-06 NOTE — Progress Notes (Signed)
Reconstructive Orthopedics Post-Op Note    Subjective:   Admit Day:07/05/2013  Post-op Day : 1  Doing well postoperatively.  Status Post: right  Knee Replacement  Pain Control: excellent  Voiding: yes   Able to ambulate to bathroom: yes   Nausea/ Vomiting: no   Chest Pain: no       Objective::     MENTAL Status: awake and alert; oriented to person, place, and time    right  Lower Extremity    SURGICAL DRESSING CLEAN AND DRY:Yes  WOUND Exam:   DVT Exam: No evidence of DVT seen on physical exam.  ABLE to Dorsiflex Foot: yes    NEURO Exam: Sensory: in tact LE'S  MOTOR Exam: able to move affected leg with difficulty  VASCULAR Exam:         Labs:  Lab Results   Component Value Date    WBC 5.6 02/12/2013    HGB 13.3 02/12/2013    HCT 39.9 02/12/2013    MCV 86.7 02/12/2013    PLT 179 02/12/2013     No components found with this basename: GLU     No results found for this basename: PROTIME        Xray: No results found for this or any previous visit.  No results found for this or any previous visit.    Impression:   Principal Problem:    Primary localized osteoarthrosis, lower leg          Plan:   1.   Continue Weight-bearing as ordered-Continue DVT prophylaxis-Continue PT/OT-Continue Pain  meds  2.    Dressing changes as ordered-- Reinforce with gauze prn  3     DC Plan: Home              Home PT: yes  Home RN: no  4.    DC when cleared

## 2013-07-06 NOTE — Progress Notes (Signed)
Pt assisted OOB, using Grandt, ambulated to BR approx. 25 ft. Voided s difficulty, returned to bed ice and aboots in place.

## 2013-07-06 NOTE — Progress Notes (Signed)
Physical Therapy  Discharge Note  Attempted to see for PT- pt stated pain is much better now & ready for dc.  Declined need for therapy & wants to leave after lunch which just arrived.  Reviewed with pt how to do stairs & to have family A up the steps.  Wide Maita issued for pt.  Home PT will begin Monday per pt.  No futher IP PT needs.  DC home today with family.  Goals not met since seen for evaluation only.  Wilma Flavin, PT [phone]

## 2013-07-06 NOTE — Progress Notes (Signed)
Physical Therapy      Initial Assessment and Treatment    Date: 07/06/2013  Patient Name: Kathryn Goodman  MRN: [id number]    DOB: November 09, 1970         Restrictions  Position Activity Restriction  Other position/activity restrictions: WBAT; activity per protocol  Vision/Hearing  Vision  Vision: Within Functional Limits (wears glasses)  Hearing  Hearing: Within functional limits     Subjective  General  Chart Reviewed: Yes  Additional Pertinent Hx: Pt is a 42 y.o. female adm 12/19 with R knee OA.  Pt s/p R TKR 12/19.  PMH:  DM, hyperlipidemia, HTN, migraines, PE, arthritis, GERD, R knee ascope, hernia repair, ORIF R foot 5th metatarsal  Referring Practitioner: Swank  Referral Date : 07/05/13  Subjective  Subjective: "I'm just concerned about the pain."  Pain Screening  Patient Currently in Pain:  (R knee, not rated, RN aware)         Orientation  Orientation  Overall Orientation Status: Within Functional Limits  Home Living  Home Living  Lives With: Spouse  Type of Home: House  Home Layout: Multi-level (1 flight to second floor)  Home Access: Stairs to enter without rails (2)  Bathroom Shower/Tub: Medical sales representative: Standard (sink next to toilet)  Bathroom Equipment: Grab bars in Medical laboratory scientific officer Accessibility: Accessible  Home Equipment: Crutches  Objective     Prior Function  ADL Assistance: Independent  Homemaking Assistance: Independent  Ambulation Assistance: Independent  Transfer Assistance: Independent  Vocational:  (works for [employer] at home)  AROM RLE (degrees)  RLE AROM:  (R knee AAROM approx 5 - 60 degrees)  AROM LLE (degrees)  LLE AROM: WFL  Strength RLE  Strength RLE:  (mod to max assist for SLR, min assist for hip abd and HS)  Strength LLE  Strength LLE: WFL           Transfers  Sit to Stand: Stand by assistance  Stand to sit: Stand by assistance  Ambulation  Ambulation?: Yes  Ambulation 1  Device: Standard Schoenberg  Assistance: Contact guard assistance (to SBA)  Quality of Gait: decreased stance  time RLE, decreased cadence, antalgic gt  Distance: 15', 50'  Stairs/Curb  Stairs?:  (Up/down 4 steps with rail/cx CGA with effort.  Up/down 1 step with Serda - up backward- CGA)        Exercises  Straight Leg Raise: mod assist x 10 reps RLE  Quad Sets: independent x 10 reps  Heelslides: min assist x 10 reps  Gluteal Sets: independent x 10 reps  Knee Long Arc Quad: independent x 10 reps  Ankle Pumps: independent x 10 reps   Treatment included: gt, transfers, exercises, pt education    Assessment   Assessment  Assessment: Pt demonstating safe mobility with CG/SBA.  Gt and steps are effortful 2/2 pain.  Discussed with pt maybe staying on first floor for a day or two if needed.  Pt reports she has a half bath and recliner on 1st floor.  Feel pt's mobility and confidence on steps will improve with practice and adequate pain control.  Encouraged pt to have someone with her when she does steps and to practice with home therapist.  Pt verbalized understanding  Discharge Recommendations: 24 hr. supervision assist;Home PT  Requires PT Follow Up: Yes  Timed Code Treatment Minutes: 45 Minutes  Total Treatment Time: 60  PT D/C Equipment  Equipment Needed: Yes (wide std Leyba)       Plan  Plan  Times per week: will f/u 1 more time today to see if pt has further questions or if she needs to practice anything again  Patient Education: Educated pt on role of PT, use of call light, weightbearing status, knee precautions, activity at home.  Pt verbalized understanding  Safety Devices  Safety Devices in place: Yes  Type of devices: Call light within reach, Left in chair, Nurse notified      Goals  Short term goals  Time Frame for Short term goals: By discharge  Short term goal 1: Sit to stand supervision  Short term goal 2: Pt will amb >70' with AD SBA  Short term goal 3: Pt will up/down 4 steps with cx SBA     If pt d/c'd prior to next treatment, this note serves as a discharge note.    Loraine Grip  License and Documentation  [facility]  Therapy License Number: PT [id number]

## 2013-07-06 NOTE — Plan of Care (Signed)
Problem: Mobility - Impaired:  Goal: Mobility will improve  Mobility will improve   Outcome: Ongoing  Pt able to transfer in/out of bed per self. Ambulated to BR several times overnight s difficulty.

## 2013-07-06 NOTE — Progress Notes (Signed)
NUTRITION RISK ASSESSMENT     Admission Date: 07/05/2013      Timepoint: initial,  Consult for BMI   Kathryn Goodman is a 42 y.o. W  female     REASON FOR ADMISSION / MEDICAL HISTORY with INTERVAL HISTORY:  Admitted with PRIMARY LOCALIZED OSTEOARTHROSIS TKR    SUBJECTIVE / DATA  12/20  Pt state that she has made changes in diet due to DM and appreciated education re "adding carbs back in."  Provided DM ed re carb counting, and encouraged referral to outpt diabetic ed.  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    Education:  DM ed, wt mgt.    OBJECTIVE DATA:  Patient Active Problem List    Diagnosis Date Noted   ??? Primary localized osteoarthrosis, lower leg 07/05/2013     Past Medical History   Diagnosis Date   ??? Thyroid disease    ??? Diabetes mellitus (HCC)    ??? Hyperlipidemia    ??? Hypertension    ??? Migraines    ??? Convulsion (HCC) 03/2013     x 1 episode, negative workup   ??? Pulmonary emboli (HCC)      2 P.E.'s post foot surgery 2011   ??? Arthritis      knees, hips   ??? GERD (gastroesophageal reflux disease)    ??? Obesity      Labs:  No results found for this basename: NA, K, CL, CO2, BUN, CREATININE, GLUCOSE, CA,  in the last 72 hours  No results found for this basename: PHOS       No results found for this basename: AST, ALT, ALB, BILIDIR, BILITOT, ALKPHOS,  in the last 72 hours  Lab Results   Component Value Date    LABALBU 4.2 02/12/2013      Lab Results   Component Value Date    TRIG 128 02/12/2013    HDL 40 02/12/2013    HDL 41 08/27/2010    LDLCALC 110 02/12/2013    LABVLDL 26 02/12/2013     No results found for this basename: LIPASE,  in the last 72 hours  No results found for this basename: LABA1C     Lab Results   Component Value Date    WBC 5.6 02/12/2013    RBC 4.60 02/12/2013    HGB 13.3 02/12/2013    HCT 39.9 02/12/2013    MCV 86.7 02/12/2013    MCH 28.8 02/12/2013    MCHC 33.2 02/12/2013    RDW 15.3 02/12/2013    PLT 179 02/12/2013    MPV 11.2 02/12/2013     Lab Results   Component Value Date    VITAMINB12 212  08/27/2010      Lab Results   Component Value Date    FOLATE 16.93 08/27/2010     Continuous Medications:        Scheduled Medications:   ??? sodium chloride flush  10 mL Intravenous Q12H [facility]   ??? docusate sodium  100 mg Oral BID   ??? rivaroxaban  10 mg Oral Daily   ??? gabapentin  300 mg Oral Nightly   ??? oxyCODONE HCl ER  10 mg Oral Q12H SCH   ??? dexamethasone  5 mg Intravenous Q6H   ??? celecoxib  200 mg Oral Daily   ??? DULoxetine  30 mg Oral QPM   ??? pantoprazole  40 mg Oral QAM AC   ??? levothyroxine  100 mcg Oral Daily   ??? ramipril  5 mg  Oral Daily   ??? insulin glargine  25 Units Subcutaneous Nightly   ??? insulin lispro  0-12 Units Subcutaneous TID WC   ??? insulin lispro  0-6 Units Subcutaneous Nightly     Anthropometric Measures   Height: 5\' 7"  (170.2 cm)   Current Weight: Weight: 329 lb (149.233 kg)    Admission weight: 329 lb (149.233 kg)  Source not recorded.    Body mass index is 51.52 kg/(m^2). at CBW   Less than 18.5 Underweight  18.5-24.9 Normal Weight  25-29.9 Overweight  30-34.9 Obese Class I  35-39.9 Obese Class II  Greater than or equal to 40 Obese Class III at 329#    Patient Vitals for the past 96 hrs (Last 3 readings):   Weight   07/05/13 1008 329 lb (149.233 kg)     Wt Readings from Last 50 Encounters:   07/05/13 329 lb (149.233 kg)   07/04/13 329 lb (149.233 kg)   02/04/10 295 lb (133.811 kg)   02/01/10 295 lb (133.811 kg)       Comparative Standards Not Pertinent at this time.    Nutrition-focused Physical Findings           Skin/Edema:  Skin Integrity (WDL): Within Defined Limits         No issues reported  Chewing / Swallowing:   no issues reported  Food/Nutrition-Related History  Pre-Admission / Home Diet:  Pre-Admission/Home Diet: Carb Control  Food Restrictions / Cultural Requests:    none reported  Allergies as of 07/05/2013 - Review Complete 07/05/2013   Allergen Reaction Noted   ??? Alpha blocker quinazolines Hives 02/01/2010   ??? Codeine Itching 02/01/2010   ??? Penicillins Hives 02/01/2010   ??? Sulfa  antibiotics Hives 02/01/2010     PO Diet Orders / Intake  Current diet and supplement order: DIET CARB CONTROL;     Feeding Assistance:  self            NSG Recorded PO:   last meal; 100%  Pain does  not appear to affect PO intake  Intake vs. Needs: Needs Met    NUTRITION DIAGNOSIS and GOAL  Nutrition Deficit Risk has not been added to Interdisciplinary Care Plan.  Goal: intake greater than 50% meals ordered.    MALNUTRITION  Pt does not meet AND/ASPEN malnutrition guidelines at this time.    NUTRITION RECOMMENDATIONS / PRESCRIPTION /  MONITORING / EVALUATION  1. Encourage po intake on carb control diet.  2. Recommend referral to out patient dm ed.  3. Provided ed re carb counting and outpt wt mgt.  4. Monitor  nutritional intake.    Patient with low/moderate nutrition risk at this time.  Will reassess in 4-7 days. Consult dietitian if intervention is needed before that time.    Marcy Salvo, RD, LD   Pager: [phone]   Office:  [phone]

## 2013-07-06 NOTE — Progress Notes (Signed)
Occupational Therapy   Occupational Therapy Initial Assessment and Treatment  Discharge  Date: 07/06/2013   Patient Name: Kathryn Goodman  MRN: [id number]     DOB: 10/12/1970           Restrictions  Position Activity Restriction  Other position/activity restrictions: WBAT;   Vision/Hearing  Vision  Vision: Within Functional Limits (wears glasses)  Hearing  Hearing: Within functional limits  Subjective   General  Chart Reviewed: Yes  Family / Caregiver Present: No  Referring Practitioner: Liliane BadeJon Grote PA  Diagnosis: 42 y.o. F to OR 12/20 for R TKR.  Subjective  Subjective: Agreeable to work with OT.  States her family is supportive and has no concerns for discharge home related to ADLs and functional transfers.  Pain Screening  Patient Currently in Pain: Yes (5/10 knee pain - nurse aware and addressing request for meds)         Home Living  Home Living  Lives With: Spouse  Type of Home: House  Home Layout: Multi-level (1 flight to second floor)  Home Access: Stairs to enter without rails (2)  Bathroom Shower/Tub: Medical sales representativeTub/Shower unit  Bathroom Toilet: Standard (sink next to toilet)  Bathroom Equipment: Grab bars in Medical laboratory scientific officershower  Bathroom Accessibility: Accessible  Home Equipment: Crutches     Orientation  Orientation  Overall Orientation Status: Within Normal Limits  Objective      Prior Function  ADL Assistance: Independent  Homemaking Assistance: Independent  Ambulation Assistance: Independent  Transfer Assistance: Independent  Vocational:  (works for [employer] at home)     Engineer, maintenance (IT)Balance  Sitting Balance: Independent  Standing Balance: Supervision (static w/ [employer]; SBA for ADLs and fucntional mobility using [place])    Copywriter, advertisingToilet Transfers  Toilet - Technique: Ambulating (using std Doubleday and SBA)  Equipment Used: Standard toilet (using leverage to R)  Transfer Level: Stand by assistance  Tub Transfers  Tub Transfers Comments: Note: pt has tub shower and does not have shower chair currently - plans to purchase or borrow; pt educated in safe tub  seat transfer technique - reference made to illustrated transfer in TJR book (pt denies concerns)  ADL  Feeding: Independent;Setup  Grooming: Stand by assistance (in standing)  UE Dressing: Independent;Setup (seated)  LE Dressing: Stand by assistance  Toileting: Independent  Tone RUE  RUE Tone: Normotonic  Tone LUE  LUE Tone: Normotonic  Coordination  Movements Are Fluid And Coordinated: Yes  Functional Activity Tolerance  Functional Activity Tolerance: Tolerates 30 min exercise with multiple rests  Bed mobility  Supine to Sit:  (NT - up in chair on entry)  Transfers  Sit to stand: Stand by assistance  Stand to sit: Stand by assistance     Cognition  Overall Cognitive Status: WNL              LUE Strength  Gross LUE Strength: WFL  RUE Strength  Gross RUE Strength: WFL               Pt seen by OT for eval and treat. Treatment included:  Functional transfer, ADL, pt education        Assessment   Assessment  Comments: Pt demonstrated SBA with functional transfers and ADLs.  Recommend Home with assist prn.  No further OT needs indicated.  No DME needs.  Recommendations: Home with assist as needed;Anticipate no further OT  Goal Formulation: Patient  No Skilled OT: No acute OT goals identified  Requires OT Follow Up: No  Timed Code Treatment Minutes: 30  Minutes  Total Treatment Time: 45  Response to Treatment  Response to Treatment: Patient Tolerated treatment well  Safety Devices  Safety Devices in place: Yes  Type of devices: Nurse notified;Left in chair;Call light within reach (nurse present; pt was sitting up in chair w/o alarm - left pt as found)  OT D/C Equipment  Equipment Needed: Yes (recommend shower chair - pt aware and plans to purchase/borrow)       Plan   Plan  Patient Education: Role of OT - stated understanding; modified ADL/transfer techniques - demo learning           Discharge acute OT - no further OT needs indicated.           Debbrah Sampedro OTR/L [phone]

## 2013-07-06 NOTE — Discharge Summary (Signed)
Orthopedic  Discharge Summary  Patient ID:  Name: Kathryn Goodman   MRN: [id number]  AGE:  42 y.o.:  DOB:  [birth year]     Admission Date:  07/05/2013  5:51 AM    Discharge Date: 07/06/2013     Admitting Physician:  Rosemarie Beath, MD    Discharge Physician:  Rosemarie Beath, MD    Allergies: Alpha blocker quinazolines; Codeine; Penicillins; and Sulfa antibiotics    Consultant:  IP CONSULT TO HOSPITALIST    Dr Allena Katz  IP CONSULT TO DIETITIAN  IP CONSULT TO SOCIAL WORK       Admission Diagnoses:  Primary localized osteoarthrosis, lower leg    Procedures: right total Knee replacement    HPI: This patient presented with a chief complaint of right   Knee pain.  Having failed reasonable attempts at pain control and after appropriate medical clearance-the surgical procedure was scheduled with the patient's consent.    Hospital Course:  The patient was taken to the OR for the scheduled procedure, tolerated well and was transferred to the PACU in stable condition.  The patient did well orthopedically and was followed by the hospitalist for medical monitoring.  There were not any complications to report. Her glucose levels were in the 200's--steroids were used post op. After appropriate clearance She was dc'd to home .  Post-hospitalization care was set up as ordered by the Child psychotherapist.  The last set of vital signs, lab work, and diagnostic studies include:    Vital Signs:   Filed Vitals:    07/06/13 0922   BP: 135/87   Pulse: 94   Temp: 98 ??F (36.7 ??C)   Resp: 18                  SpO2 Readings from Last 1 Encounters:   07/06/13 92%          Labs:   Lab Results   Component Value Date/Time    HGB 13.3 02/12/2013  4:36 PM    HGB 13.9 08/24/2012  2:04 PM    HGB 12.9 10/28/2011 10:49 AM          Lab Results   Component Value Date/Time    PLT 179 02/12/2013  4:36 PM            Lab Results   Component Value Date/Time    NA 146* 03/04/2013 10:11 AM    CL 106 03/04/2013 10:11 AM    BUN 15 03/04/2013 10:11 AM    CREATININE 0.7 03/04/2013 10:11  AM    GLUCOSE 160* 03/04/2013 10:11 AM    GLUCOSE 143* 02/12/2013  4:36 PM    GLUCOSE 147* 08/24/2012  2:04 PM                              No results found for this basename: INR                   Significant Diagnostic Studies:          Problem List:        Principal Problem:    Primary localized osteoarthrosis, lower leg        Discharge Disposition: Home with Home Health Care        Discharge Condition: stable    Activity: Ambulatory status - WBAT with Bi/crutches as instructed in the hospital.  Walk 5 minutes every waking hour. Home physical therapy  was ordered.  Patient Instructions:   No driving until advised by our office. No strenuous exercise until directed by our office.  Use ice pack as directed.    Diet: as prior to admission    Wound Care: as directed-may shower if no wound drainage and feels safe in doing so--change daily if drainage. If no drainage may keep open to the air. Clean around site with alcohol.    Discharge medications:   Adalay, Azucena   Home Medication Instructions ZOX:[id number]    Printed on:07/06/13 1733   Medication Information                      celecoxib (CELEBREX) 200 MG capsule  Take 200 mg by mouth daily.               DULoxetine (CYMBALTA) 30 MG capsule  Take 1 capsule by mouth every evening.             fexofenadine (ALLEGRA) 60 MG tablet  Take 60 mg by mouth as needed.               gabapentin (NEURONTIN) 300 MG capsule  Take 300 mg by mouth 3 times daily as needed.             insulin glargine (LANTUS) 100 UNIT/ML injection vial  Inject 25 Units into the skin nightly.             insulin lispro (HUMALOG) 100 UNIT/ML injection cartridge  Inject  into the skin 3 times daily (before meals). Per sliding scale             lansoprazole (PREVACID) 30 MG capsule  Take 30 mg by mouth daily.             levothyroxine (SYNTHROID) 100 MCG tablet  Take 100 mcg by mouth Daily.             oxyCODONE (ROXICODONE) 5 MG immediate release tablet  Take 1-2 q4-6h prn pain              pioglitazone (ACTOS) 30 MG tablet  Take 30 mg by mouth daily.             ramipril (ALTACE) 5 MG capsule  Take 5 mg by mouth daily.             rivaroxaban (XARELTO) 10 MG TABS tablet  Take 2 tab --20mg --daily until gone                 Follow-up: In Dr. Gareth Morgan office in about 2 weeks (patient has appointment) and with PCP and/or specialists as directed by their office and/or the medical consultant.

## 2013-07-07 MED FILL — FENTANYL CITRATE 0.05 MG/ML IJ SOLN: 0.05 MG/ML | INTRAMUSCULAR | Qty: 5

## 2013-07-07 MED FILL — MORPHINE SULFATE 10 MG/ML IJ SOLN: 10 MG/ML | INTRAMUSCULAR | Qty: 1

## 2013-07-07 MED FILL — FENTANYL CITRATE 0.05 MG/ML IJ SOLN: 0.05 MG/ML | INTRAMUSCULAR | Qty: 2

## 2013-10-27 ENCOUNTER — Emergency Department: Admit: 2013-10-27 | Payer: PRIVATE HEALTH INSURANCE

## 2013-10-27 ENCOUNTER — Inpatient Hospital Stay
Admission: EM | Admit: 2013-10-27 | Discharge: 2013-10-28 | Disposition: A | Discharge status: Other Acute Inpatient Hospital | Payer: PRIVATE HEALTH INSURANCE

## 2013-10-27 DIAGNOSIS — R079 Chest pain, unspecified: Principal | ICD-10-CM

## 2013-10-27 LAB — CBC
Hematocrit: 44.4 % (ref 35.0–45.0)
Hematocrit: 39.9 % (ref 35.0–45.0)
Hematocrit: 39.9 % (ref 35.0–45.0)
Hemoglobin: 14.9 g/dL (ref 11.7–15.5)
Hemoglobin: 13.5 g/dL (ref 11.7–15.5)
Hemoglobin: 13.5 g/dL (ref 11.7–15.5)
MCH: 28.3 pg (ref 27.0–33.0)
MCH: 28.6 pg (ref 27.0–33.0)
MCH: 28.6 pg (ref 27.0–33.0)
MCHC: 33.5 g/dL (ref 32.0–36.0)
MCHC: 33.9 g/dL (ref 32.0–36.0)
MCHC: 33.9 g/dL (ref 32.0–36.0)
MCV: 84.6 fL (ref 80.0–100.0)
MCV: 84.5 fL (ref 80.0–100.0)
MCV: 84.5 fL (ref 80.0–100.0)
MPV: 9.9 fL (ref 7.5–11.5)
MPV: 9.7 fL (ref 7.5–11.5)
MPV: 9.7 fL (ref 7.5–11.5)
Platelets: 207 10*3/uL (ref 140–400)
Platelets: 190 10*3/uL (ref 140–400)
Platelets: 190 10*3/uL (ref 140–400)
RBC: 5.25 10*6/uL (ref 3.80–5.10)
RBC: 4.73 10*6/uL (ref 3.80–5.10)
RBC: 4.73 10*6/uL (ref 3.80–5.10)
RDW: 14.1 % (ref 11.0–15.0)
RDW: 14.3 % (ref 11.0–15.0)
RDW: 14.3 % (ref 11.0–15.0)
WBC: 7.3 10*3/uL (ref 3.8–10.8)
WBC: 8 10*3/uL (ref 3.8–10.8)
WBC: 8 10*3/uL (ref 3.8–10.8)

## 2013-10-27 LAB — PTT: aPTT: 26.4 seconds (ref 24.3–33.1)

## 2013-10-27 LAB — PROTIME
INR: 1 (ref 0.9–1.1)
Protime: 13.1 seconds (ref 11.6–14.4)

## 2013-10-27 LAB — POCT GLUCOSE MONITORING DEVICE
Glucose: 374 mg/dL — ABNORMAL HIGH (ref 65–99)
Glucose: 374 mg/dL — ABNORMAL HIGH (ref 65–99)
Glucose: 302 mg/dL — ABNORMAL HIGH (ref 65–99)
Glucose: 302 mg/dL — ABNORMAL HIGH (ref 65–99)

## 2013-10-27 LAB — TROPONIN: Troponin I: <0.03 ng/mL (ref 0.00–0.03)

## 2013-10-27 LAB — BASIC METABOLIC PANEL
Anion Gap: 12 mmol/L (ref 3–16)
BUN: 20 mg/dL (ref 7–25)
CO2: 21 mmol/L (ref 21–31)
Calcium: 9.5 mg/dL (ref 8.6–10.3)
Chloride: 100 mmol/L (ref 98–110)
Creatinine: 0.75 mg/dL (ref 0.60–1.30)
GFR MDRD Af Amer: 102 See note.
GFR MDRD Non Af Amer: 84 See note.
Glucose: 344 mg/dL — ABNORMAL HIGH (ref 70–100)
Osmolality, Calculated: 292 mosm/kg (ref 278–305)
Potassium: 4.5 mmol/L (ref 3.5–5.3)
Sodium: 133 mmol/L (ref 133–146)

## 2013-10-27 LAB — VENOUS BLOOD GAS, LINE/SYRINGE
%HBO2-Line Draw: 77.7 % (ref 40.0–70.0)
Base Excess-Line Draw: -0.8 mmol/L (ref <=3.0)
CO2 Content-Line Draw: 25 mmol/L (ref 25–29)
Carboxyhgb-Line Draw: 0.9 % (ref 0.0–2.0)
HCO3-Line Draw: 24 mmol/L (ref 24–28)
Methemoglobin-Line Draw: 1.2 % (ref 0.0–1.5)
PCO2-Line Draw: 41 mm Hg (ref 41–51)
PH-Line Draw: 7.38 (ref 7.32–7.42)
PO2-Line Draw: 39 mm Hg (ref 25–40)
Reduced Hemoglobin-Line Draw: 20.2 % (ref 0.0–5.0)

## 2013-10-27 LAB — DIFFERENTIAL
Basophils Absolute: 29 /uL (ref 0–200)
Basophils Relative: 0.4 % (ref 0.0–1.0)
Eosinophils Absolute: 66 /uL (ref 15–500)
Eosinophils Relative: 0.9 % (ref 0.0–8.0)
Lymphocytes Absolute: 1832 /uL (ref 850–3900)
Lymphocytes Relative: 25.1 % (ref 15.0–45.0)
Monocytes Absolute: 475 /uL (ref 200–950)
Monocytes Relative: 6.5 % (ref 0.0–12.0)
Neutrophils Absolute: 4898 /uL (ref 1500–7800)
Neutrophils Relative: 67.1 % (ref 40.0–80.0)

## 2013-10-27 LAB — PROTIME-INR
INR: 1 (ref 0.9–1.1)
INR: 1 (ref 0.9–1.1)
Protime: 12.8 seconds (ref 11.6–14.4)
Protime: 13.1 s (ref 11.6–14.4)

## 2013-10-27 LAB — TROPONIN I
Troponin I: <0.03 ng/mL (ref 0.00–0.03)
Troponin I: <0.03 ng/mL (ref 0.00–0.03)
Troponin I: <0.03 ng/mL (ref 0.00–0.03)

## 2013-10-27 LAB — DDIMER: D-Dimer: 0.31 ug{FEU}/mL (ref 0.00–0.50)

## 2013-10-27 LAB — POC GLU MONITORING DEVICE
POC Glucose Monitoring Device: 374 mg/dL (ref 65–99)
POC Glucose Monitoring Device: 302 mg/dL — ABNORMAL HIGH (ref 65–99)

## 2013-10-27 LAB — APTT
aPTT: 25.2 s (ref 24.3–33.1)
aPTT: 26.4 seconds (ref 24.3–33.1)

## 2013-10-27 LAB — B NATRIURETIC PEPTIDE: BNP: 12 pg/mL (ref 0–100)

## 2013-10-27 MED ORDER — aspirin EC tablet 325 mg
325 | Freq: Every day | ORAL | Status: AC
Start: 2013-10-27 — End: 2013-10-28
  Administered 2013-10-28: 13:00:00 325 mg via ORAL

## 2013-10-27 MED ORDER — NON FORMULARY 5 mg
Freq: Every evening | Status: AC
Start: 2013-10-27 — End: 2013-10-27

## 2013-10-27 MED ORDER — sodium chloride 0.9 % infusion
Freq: Once | INTRAVENOUS | Status: AC
Start: 2013-10-27 — End: 2013-10-27
  Administered 2013-10-27: 17:00:00 125 mL/h via INTRAVENOUS

## 2013-10-27 MED ORDER — lisinopril (PRINIVIL,ZESTRIL) tablet 40 mg
40 | Freq: Every day | ORAL | Status: AC
Start: 2013-10-27 — End: 2013-10-27

## 2013-10-27 MED ORDER — heparin (porcine) injection 4,000 Units
5000 | Freq: Once | INTRAMUSCULAR | Status: AC
Start: 2013-10-27 — End: 2013-10-27
  Administered 2013-10-27: 23:00:00 4000 [IU] via INTRAVENOUS

## 2013-10-27 MED ORDER — morphine injection Crtg 2-4 mg
2 | INTRAVENOUS | Status: AC | PRN
Start: 2013-10-27 — End: 2013-10-28
  Administered 2013-10-27 – 2013-10-28 (×4): 2 mg via INTRAVENOUS

## 2013-10-27 MED ORDER — ondansetron (ZOFRAN) 4 mg/2 mL injection 4 mg
4 | INTRAMUSCULAR | Status: AC | PRN
Start: 2013-10-27 — End: 2013-10-28

## 2013-10-27 MED ORDER — ramipril (ALTACE) capsule 5 mg
5 | Freq: Every day | ORAL | Status: AC
Start: 2013-10-27 — End: 2013-10-28
  Administered 2013-10-28: 02:00:00 5 mg via ORAL

## 2013-10-27 MED ORDER — oxyCODONE-acetaminophen (PERCOCET) 5-325 mg per tablet 1 tablet
5-325 | ORAL | Status: AC | PRN
Start: 2013-10-27 — End: 2013-10-28
  Administered 2013-10-28 (×2): 1 via ORAL

## 2013-10-27 MED ORDER — heparin 25000 units in 0.45% NaCl 250 mL IV infusion
25000 | INTRAVENOUS | Status: AC
Start: 2013-10-27 — End: 2013-10-27

## 2013-10-27 MED ORDER — DULoxetine (CYMBALTA) DR capsule 60 mg
60 | Freq: Every day | ORAL | Status: AC
Start: 2013-10-27 — End: 2013-10-28
  Administered 2013-10-28: 13:00:00 60 mg via ORAL

## 2013-10-27 MED ORDER — nitroGLYCERIN (NITRO-BID) 2 % ointment 1 inch
2 | Freq: Once | TRANSDERMAL | Status: AC
Start: 2013-10-27 — End: 2013-10-27
  Administered 2013-10-27: 21:00:00 1 [in_us] via TOPICAL

## 2013-10-27 MED ORDER — insulin lispro (humaLOG) injection 0-5 Units
100 | Freq: Three times a day (TID) | SUBCUTANEOUS | Status: AC
Start: 2013-10-27 — End: 2013-10-28
  Administered 2013-10-27: 21:00:00 5 [IU] via SUBCUTANEOUS
  Administered 2013-10-28: 13:00:00 3 [IU] via SUBCUTANEOUS

## 2013-10-27 MED ORDER — nitroGLYCERIN (NITROSTAT) SL tablet 0.4 mg
0.4 | SUBLINGUAL | Status: AC | PRN
Start: 2013-10-27 — End: 2013-10-28
  Administered 2013-10-27: 17:00:00 0.4 mg via SUBLINGUAL

## 2013-10-27 MED ORDER — aspirin tablet 325 mg
325 | Freq: Once | ORAL | Status: AC
Start: 2013-10-27 — End: 2013-10-27
  Administered 2013-10-27: 17:00:00 325 mg via ORAL

## 2013-10-27 MED ORDER — nitroGLYCERIN (NITRO-BID) 2 % ointment 1 inch
2 | Freq: Once | TRANSDERMAL | Status: AC
Start: 2013-10-27 — End: 2013-10-27
  Administered 2013-10-27: 17:00:00 1 [in_us] via TOPICAL

## 2013-10-27 MED ORDER — heparin (porcine) injection 4,000 Units
5000 | Freq: Four times a day (QID) | INTRAMUSCULAR | Status: AC | PRN
Start: 2013-10-27 — End: 2013-10-28
  Administered 2013-10-28 (×2): 4000 [IU] via INTRAVENOUS

## 2013-10-27 MED ORDER — heparin (porcine) injection 4,000 Units
5000 | Freq: Four times a day (QID) | INTRAMUSCULAR | Status: AC | PRN
Start: 2013-10-27 — End: 2013-10-28

## 2013-10-27 MED ORDER — HYDROcodone-acetaminophen (NORCO) 5-325 mg per tablet 1 tablet
5-325 | ORAL | Status: AC | PRN
Start: 2013-10-27 — End: 2013-10-27

## 2013-10-27 MED ORDER — metoprolol tartrate (LOPRESSOR) injection 5 mg
5 | Freq: Once | INTRAVENOUS | Status: AC
Start: 2013-10-27 — End: 2013-10-27
  Administered 2013-10-27: 17:00:00 5 mg via INTRAVENOUS

## 2013-10-27 MED ORDER — gabapentin (NEURONTIN) capsule 600 mg
300 | Freq: Two times a day (BID) | ORAL | Status: AC
Start: 2013-10-27 — End: 2013-10-28
  Administered 2013-10-28: 01:00:00 600 mg via ORAL

## 2013-10-27 MED ORDER — heparin 25000 units in 0.45% NaCl 250 mL IV infusion
25000 | INTRAVENOUS | Status: AC
Start: 2013-10-27 — End: 2013-10-28
  Administered 2013-10-27: 23:00:00 12 [IU]/kg/h via INTRAVENOUS
  Administered 2013-10-28: 14:00:00 18 [IU]/kg/h via INTRAVENOUS

## 2013-10-27 MED ORDER — insulin lispro (humaLOG) injection 0-4 Units
100 | Freq: Every evening | SUBCUTANEOUS | Status: AC
Start: 2013-10-27 — End: 2013-10-28
  Administered 2013-10-28: 02:00:00 3 [IU] via SUBCUTANEOUS

## 2013-10-27 MED ORDER — dextrose 50 % in water (D50W) iv Syrg 25-50 mL
INTRAVENOUS | Status: AC | PRN
Start: 2013-10-27 — End: 2013-10-28

## 2013-10-27 MED ORDER — oxyCODONE-acetaminophen (PERCOCET) 5-325 mg per tablet 1 tablet
5-325 | Freq: Four times a day (QID) | ORAL | Status: AC | PRN
Start: 2013-10-27 — End: 2013-10-27
  Administered 2013-10-27: 21:00:00 1 via ORAL

## 2013-10-27 MED ORDER — atorvastatin (LIPITOR) tablet 40 mg
40 | Freq: Every evening | ORAL | Status: AC
Start: 2013-10-27 — End: 2013-10-28
  Administered 2013-10-28: 01:00:00 40 mg via ORAL

## 2013-10-27 MED ORDER — acetaminophen (TYLENOL) tablet 650 mg
325 | ORAL | Status: AC | PRN
Start: 2013-10-27 — End: 2013-10-28

## 2013-10-27 MED ORDER — cyclobenzaprine (FLEXERIL) tablet 10 mg
10 | Freq: Three times a day (TID) | ORAL | Status: AC | PRN
Start: 2013-10-27 — End: 2013-10-28

## 2013-10-27 MED ORDER — insulin glargine (LANTUS) injection 20 Units
100 | Freq: Every evening | SUBCUTANEOUS | Status: AC
Start: 2013-10-27 — End: 2013-10-28
  Administered 2013-10-28: 01:00:00 20 [IU] via SUBCUTANEOUS

## 2013-10-27 MED ORDER — nitroGLYCERIN (NITRO-BID) 2 % ointment 0.5 inch
2 | Freq: Four times a day (QID) | TRANSDERMAL | Status: AC
Start: 2013-10-27 — End: 2013-10-28
  Administered 2013-10-27 – 2013-10-28 (×2): 0.5 [in_us] via TOPICAL

## 2013-10-27 MED FILL — GABAPENTIN 300 MG CAPSULE: 300 300 MG | ORAL | Qty: 2

## 2013-10-27 MED FILL — SODIUM CHLORIDE 0.9 % INTRAVENOUS SOLUTION: 125.00 125.00 mL/hr | INTRAVENOUS | Qty: 1000

## 2013-10-27 MED FILL — HEPARIN (PORCINE) 25,000 UNIT/250 ML IN 0.45 % SODIUM CHLORIDE IV SOLN: 25000 25,000 unit/250 mL | INTRAVENOUS | Qty: 250

## 2013-10-27 MED FILL — METOPROLOL TARTRATE 5 MG/5 ML INTRAVENOUS SOLUTION: 5 5 mg/5 mL | INTRAVENOUS | Qty: 5

## 2013-10-27 MED FILL — HUMALOG U-100 INSULIN 100 UNIT/ML SUBCUTANEOUS SOLUTION: 100 100 unit/mL | SUBCUTANEOUS | Qty: 3

## 2013-10-27 MED FILL — LANTUS U-100 INSULIN 100 UNIT/ML SUBCUTANEOUS SOLUTION: 100 100 unit/mL | SUBCUTANEOUS | Qty: 0.2

## 2013-10-27 MED FILL — HEPARIN (PORCINE) 5,000 UNIT/ML INJECTION SOLUTION: 5000 5,000 unit/mL | INTRAMUSCULAR | Qty: 1

## 2013-10-27 MED FILL — ASPIRIN 325 MG TABLET: 325 325 MG | ORAL | Qty: 1

## 2013-10-27 MED FILL — ATORVASTATIN 40 MG TABLET: 40 40 MG | ORAL | Qty: 1

## 2013-10-27 MED FILL — MORPHINE 2 MG/ML INTRAVENOUS CARTRIDGE: 2 2 mg/mL | INTRAVENOUS | Qty: 1

## 2013-10-27 MED FILL — OXYCODONE-ACETAMINOPHEN 5 MG-325 MG TABLET: 5-325 5-325 mg | ORAL | Qty: 1

## 2013-10-27 MED FILL — NITRO-BID 2 % TRANSDERMAL OINTMENT: 2 2 % | TRANSDERMAL | Qty: 1

## 2013-10-27 MED FILL — NITROSTAT 0.4 MG SUBLINGUAL TABLET: 0.4 0.4 mg | SUBLINGUAL | Qty: 25

## 2013-10-27 NOTE — Unmapped (Signed)
D. Pt is on the bed, A/O x4, she denies chest pain but she complain of headache. She mention of lightheadedness earlier but now its gone.  A. Given Morphine 2 mg IVP for headache, started heparin at 12 units/kg/hr. Assessment completed. BP elevated and rechecked, it is now normal after taking her BP pill. Bed alarm on. Attended needs and call light in reach. Jkwon Treptow U Cameron Katayama

## 2013-10-27 NOTE — Unmapped (Signed)
Admission documentation completed

## 2013-10-27 NOTE — Unmapped (Signed)
Patient on Cardiac monitor - NSR at 92bpm; EKG being completed

## 2013-10-27 NOTE — Unmapped (Signed)
[place] [facility]  [facility]      Admit Date: 10/27/2013    Patient's PCP: Wynema Birch, MD  DOB:  [birth year]  Admit Status : Observation    CHIEF COMPLAINT  Chest pain, shortness of breath    HISTORY OF PRESENT ILLNESS:  Tamara Cox is a 43 year old female who presented to the emergency department for evaluation of substernal chest pain.  The patient developed substernal chest pain and shortness of breath yesterday evening while she was in the shower.  She describes it as a pressure-like heaviness, as if someone is sitting on her chest.  It was associated with a feeling like she might pass out.  No nausea, no diaphoresis.  She had to get out of the shower. Subsequently was developing similar chest pain with minimal exertion, even with brushing her teeth.  The patient states her pain resolves at rest.   She denies any flank pain or back pain.  She denies any extremity edema.  She has had no PND or orthopnea. The patient denies any fevers or chills.  She denies any night sweats.  Pain was relieved with nitroglycerin in ED, although now she has a whopping headache.    Initially stated that she had been feeling in her normal state of health, but then recalls that she had had an episode of diaphoresis last week while she was singing in choir.  No other associated symptoms at that time, however.      PAST MEDICAL HISTORY  ??? Arthritis    ??? Diabetes mellitus    ??? Obesity    ??? DVT/ Saddle PE (pulmonary embolism)- 2011    ??? Seizure- x 1;  1 year ago, no recurrence        PAST SURGICAL HISTORY  Past Surgical History   Procedure Laterality Date   ??? Hysterectomy     ??? Hernia repair     ??? Knee surgery  right   ??? Tumor excision  right arm   ??? Foot surgery  right   ??? Rotator cuff repair     ??? Mandible surgery     ??? Oophorectomy         MEDICATIONS    ??? celecoxib (CELEBREX) 200 MG capsule Take 200 mg by mouth 2 times a day.       ??? DULoxetine (CYMBALTA) 60 MG capsule Take 60 mg by mouth daily.        ??? gabapentin (NEURONTIN) 300 MG tablet Take 600 mg by mouth 2 times a day.        ??? insulin glargine (LANTUS) 100 unit/mL injection Inject 20 Units subcutaneously at bedtime.       ??? INSULIN LISPRO (HUMALOG KWIKPEN SUBQ) Inject subcutaneously. Sliding scale before meals maximum 16 units       ??? lansoprazole (PREVACID) 30 MG capsule Take 30 mg by mouth every morning before breakfast.       ??? levothyroxine (SYNTHROID, LEVOTHROID) 100 MCG tablet Take 100 mcg by mouth daily.       ??? ramipril (ALTACE) 5 MG capsule Take 5 mg by mouth qhs            ALLERGIES:  Penicillins; Quinolones; and Sulfa (sulfonamide antibiotics)    SOCIAL HISTORY:  TOBACCO:   reports that she has never smoked.  ETOH:   reports that she does not drink alcohol.    FAMILY HISTORY:  Family History   Problem Relation Age of Onset   ???  Migraines Mother    ??? Migraines Sister    ??? Migraines Maternal Aunt    ??? Diabetes Maternal Uncle    ??? Diabetes Maternal Grandmother    ??? Other Maternal Grandmother      SLE   ??? Dementia Maternal Grandfather    ??? Diabetes Paternal Grandmother    ??? Dementia Paternal Grandfather    ??? Seizures Son 2     Febrile seizures       REVIEW OF SYSTEMS:  Please see HPI  Otherwise a 10-organ ROS was obtained and otherwise unremarkable.    PHYSICAL EXAM:  BP 137/90   Pulse 85   Temp(Src) 98.4 ??F (36.9 ??C) (Oral)   Resp 16   Ht 5' 7 (1.702 m)   Wt 321 lb 14.4 oz (146.013 kg)   BMI 50.4 kg/m2   SpO2 96%      Gen:  AAO x 3, NAD  HEENT:  PERRL, EOMI, sclerae anicteric   MMM, neck without lymphadenopathy, thyromegaly,   Or bruit  CV:  RRR, No MRG  Lungs: CTA bilaterally, no RRW  Abd:  S, NT, ND, BS+, no HSM  Ext:  No edema, DPs / PTs 2/4+ bilaterally      LABS:    Results for orders placed during the hospital encounter of 10/27/13   TROPONIN I       Result Value Range    Troponin I <0.03  0.00 - 0.03 ng/mL   B NATRIURETIC PEPTIDE       Result Value Range    BNP 12  0 - 100 pg/mL   BASIC METABOLIC PANEL       Result Value Range    Sodium  133  133 - 146 mmol/L    Potassium 4.5  3.5 - 5.3 mmol/L    Chloride 100  98 - 110 mmol/L    CO2 21  21 - 31 mmol/L    Anion Gap 12  3 - 16 mmol/L    BUN 20  7 - 25 mg/dL    Creatinine 1.61  0.96 - 1.30 mg/dL    Glucose 045 (*) 70 - 100 mg/dL    Calcium 9.5  8.6 - 40.9 mg/dL    GFR MDRD Af Amer 811      GFR MDRD Non Af Amer 84      Osmolality, Calculated 292  278 - 305 mOsm/kg   CBC       Result Value Range    WBC 7.3  3.8 - 10.8 10E3/uL    RBC 5.25 (*) 3.80 - 5.10 10E6/uL    Hemoglobin 14.9  11.7 - 15.5 g/dL    Hematocrit 91.4  78.2 - 45.0 %    MCV 84.6  80.0 - 100.0 fL    MCH 28.3  27.0 - 33.0 pg    MCHC 33.5  32.0 - 36.0 g/dL    RDW 95.6  21.3 - 08.6 %    Platelets 207  140 - 400 10E3/uL    MPV 9.9  7.5 - 11.5 fL   DIFFERENTIAL       Result Value Range    Neutrophils Relative 67.1  40.0 - 80.0 %    Lymphocytes Relative 25.1  15.0 - 45.0 %    Monocytes Relative 6.5  0.0 - 12.0 %    Eosinophils Relative 0.9  0.0 - 8.0 %    Basophils Relative 0.4  0.0 - 1.0 %    Neutrophils Absolute 4898  1500 -  7800 /uL    Lymphocytes Absolute 1832  850 - 3900 /uL    Monocytes Absolute 475  200 - 950 /uL    Eosinophils Absolute 66  15 - 500 /uL    Basophils Absolute 29  0 - 200 /uL   PROTIME-INR       Result Value Range    Protime 12.8  11.6 - 14.4 seconds    INR 1.0  0.9 - 1.1   APTT       Result Value Range    aPTT 25.2  24.3 - 33.1 seconds   VENOUS BLOOD GAS, LINE/SYRINGE       Result Value Range    PH-Line Draw 7.38  7.32 - 7.42    PCO2-Line Draw 41  41 - 51 mm Hg    PO2-Line Draw 39  25 - 40 mm Hg    HCO3-Line Draw 24  24 - 28 mmol/L    CO2 Content-Line Draw 25  25 - 29 mmol/L    Base Excess-Line Draw -0.8  -2.0 - 3.0 mmol/L    %HBO2-Line Draw 77.7 (*) 40.0 - 70.0 %    Carboxyhgb-Line Draw 0.9  0.0 - 2.0 %    Methemoglobin-Line Draw 1.2  0.0 - 1.5 %    Reduced Hemoglobin-Line Draw 20.2 (*) 0.0 - 5.0 %   DDIMER       Result Value Range    D-Dimer 0.31  0.00 - 0.50 ug/mL FEU         EKG:  normal sinus rhythm, rate 88, Qs in III  and aVF, no ischemic ST or T wave changes     RADIOLOGY:    X-ray Chest Pa And Lateral   10/27/2013   HISTORY chest pain  PA and lateral chest  Heart size and vascularity within normal limits. No acute infiltrates. Mild elevation right hemidiaphragm. Appearance similar to prior study 03/04/2010  IMPRESSION  No evidence of acute cardiopulmonary disease  Report Verified by: Fransico Meadow, MD at 10/27/2013 12:49 PM       ASSESSMENT AND PLAN:    - chest pain:  PE ruled out with negative d-dimer.  Fairly concerning sounding for angina based on story and risk factors.  In spite of negative troponin and EKG, am concerned enough to start heparin overnight for presumed acute coronary syndrome.  D/w Cardio, who will see in am.  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  sliding scale insulin.  - HTN:  Continue home ramipril  - Hypothyroidism:  Continue home synthroid        Olegario Emberson J [place] MD  Pager:  [phone]

## 2013-10-27 NOTE — Unmapped (Addendum)
[facility] ED Note    Date of service:  10/27/2013    Reason for Visit: Chest Pain and Shortness of Breath      Patient History     HPI Tamara Cox is a 43 year old female who presented to the emergency department for evaluation of  Substernal chest pain.  The patient began developing substernal chest pain which started last evening.  She describes it as a pressure heaviness as if someone is sitting on her chest.  She rates her pain as a 6/10.  She describes it as a pressure heaviness.  She describes pain worsening with activity and standing.  The patient states her pain resolves at rest.  She is complaining of dyspnea shortness of breath and states that she cannot catch her breath.  She denies any flank pain or back pain.  She denies any extremity edema.  She has had no PND or orthopnea.  The patient denies any fevers or chills.  She denies any night sweats.     Past Medical History   Diagnosis Date   ??? Arthritis    ??? Diabetes mellitus    ??? DVT (deep venous thrombosis)    ??? Obesity    ??? PE (pulmonary embolism)    ??? Seizures        Past Surgical History   Procedure Laterality Date   ??? Hysterectomy     ??? Hernia repair     ??? Knee surgery  right   ??? Tumor excision  right arm   ??? Foot surgery  right   ??? Rotator cuff repair     ??? Mandible surgery     ??? Oophorectomy         Patient  reports that she has never smoked. She does not have any smokeless tobacco history on file. She reports that she does not drink alcohol or use illicit drugs.      Previous Medications    CELECOXIB (CELEBREX) 200 MG CAPSULE    Take 200 mg by mouth 2 times a day.    CYCLOBENZAPRINE (FLEXERIL) 10 MG TABLET    Take 10 mg by mouth 3 times a day as needed.    DULOXETINE (CYMBALTA) 60 MG CAPSULE    Take 60 mg by mouth daily.    GABAPENTIN (NEURONTIN) 300 MG TABLET    Take 600 mg by mouth 2 times a day.     HYDROCODONE-ACETAMINOPHEN (NORCO) 5-325 MG PER TABLET    Take 1 tablet by mouth every 6 hours  as needed for Pain.    LIRAGLUTIDE (VICTOZA 2-PAK) 0.6 MG/0.1 ML (18 MG/3 ML) PNIJ    Inject 1.8 mg subcutaneously daily.    OXYCODONE-ACETAMINOPHEN (PERCOCET) 5-325 MG PER TABLET    Take 1 tablet by mouth every 6 hours as needed for Pain.    RAMIPRIL (ALTACE) 10 MG TABLET    Take 10 mg by mouth daily.       Allergies:   Allergies as of 10/27/2013 - Fully Reviewed 10/27/2013   Allergen Reaction Noted   ??? Penicillins Hives 12/12/2011   ??? Quinolones Hives 12/12/2011   ??? Sulfa (sulfonamide antibiotics) Hives 12/12/2011       Review of Systems     Review of Systems  Constitutional:  Denies fever or chills   Eyes:  Denies change in visual acuity   HENT:  Denies nasal congestion or sore throat   Respiratory:  See HPI  Cardiovascular:  See HPI  GI:  Denies abdominal pain, nausea,  vomiting, bloody stools or diarrhea   GU:  Denies dysuria   Musculoskeletal:  Denies back pain or joint pain   Integument:  Denies rash   Neurologic:  Denies headache, focal weakness or sensory changes   Endocrine:  Denies polyuria or polydipsia   Lymphatic:  Denies swollen glands   Psychiatric:  Denies depression or anxiety     Physical Exam     ED Triage Vitals   Vital Signs Group      Temp 10/27/13 1151 98.2 ??F (36.8 ??C)      Temp Source 10/27/13 1151 Oral      Heart Rate 10/27/13 1151 93      Heart Rate Source 10/27/13 1151 Monitor      Resp 10/27/13 1151 15      BP 10/27/13 1151 166/100 mmHg      BP Location 10/27/13 1151 Right arm      BP Method 10/27/13 1151 Automatic      Patient Position 10/27/13 1151 Lying   SpO2 10/27/13 1151 94 %   O2 Device 10/27/13 1151 None (Room air)       ED Physical Exam  Constitutional:  Well developed, well nourished, no acute distress, non-toxic appearance   Eyes:  PERRL, conjunctiva normal   HENT:  Atraumatic, external ears normal, nose normal, oropharynx moist, no pharyngeal exudates. Neck- normal range of motion, no tenderness, supple   Respiratory:  No respiratory distress, normal breath sounds, no rales,  no wheezing   Cardiovascular:  Normal rate, normal rhythm, no murmurs, no gallops, no rubs   GI:  Soft, nondistended, normal bowel sounds, nontender, no organomegaly, no mass, no rebound, no guarding   GU:  No costovertebral angle tenderness   Musculoskeletal:  No edema, no tenderness, no deformities. Back- no tenderness  Integument:  Well hydrated, no rash   Lymphatic:  No lymphadenopathy noted         Diagnostic Studies     Labs:    Please see electronic medical record for any tests performed in the ED    Radiology:    Please see electronic medical record for any tests performed in the ED    EKG:    EKG Interpretation    Interpreted by me    Rhythm: normal sinus   Rate: normal  Axis: normal  Ectopy: none  Conduction: normal  ST Segments: no acute change  T Waves: no acute change  Q Waves: none    Clinical Impression: no acute changes and normal EKG    Emergency Department Procedures     Procedures none    ED Course and MDM     Tamara Cox is a 43 y.o. female who presented to the emergency department with Chest Pain and Shortness of Breath  The patient presented to the emergency department for evaluation of chest pain.  The patient denies any fevers or chills.  She was complaining of dyspnea.  She describes the pain and discomfort as if someone is sitting on her chest.  The patient for her discomfort and pain was given lopressor IV push for blood pressure control.  She received aspirin and nitroglycerin sublingual.  The patient had an initial EKG obtained which showed normal sinus rhythm normal axis with no acute ischemic changes.  The patient had cardiac profile obtained, CBC and renal.  The patient also had a chest x-ray pain.  The patient troponin and BNP are negative.  Renal profile is normal.  Her d-dimer is negative.  The  patient has multiple risk factors for ACS and with her history will need to be admitted for risk stratification for ACS.             Critical Care Time (Attendings)   None    Final  impression is ACS    Disposition is admit        Cherly Anderson, MD  10/27/13 1310    Cherly Anderson, MD  11/04/13 1051

## 2013-10-27 NOTE — Unmapped (Signed)
Patient presents with chest pain and SOB that started last night; Patient states it feels like a weight is sitting on her chest and it started last night; SOB with exertion

## 2013-10-27 NOTE — Other (Unsigned)
[facility]                                                        [facility] ED Note    Date of service:  10/27/2013    Reason for Visit: Chest Pain and Shortness of Breath      Patient History    HPI Kathryn Goodman is a 43 year old female who presented to the emergency  department for evaluation of  Substernal chest pain.  The patient began developing substernal chest pain   which  started last evening.  She describes it as a pressure heaviness as if someone   is  sitting on her chest.  She rates her pain as a 6/10.  She describes it as a  pressure heaviness.  She describes pain worsening with activity and standing.  The patient states her pain resolves at rest.  She is complaining of dyspnea  shortness of breath and states that she cannot catch her breath.  She denies   any  flank pain or back pain.  She denies any extremity edema.  She has had no PND   or  orthopnea.  The patient denies any fevers or chills.  She denies any night  sweats.    Past Medical History  Diagnosis Date   Arthritis   Diabetes mellitus   DVT (deep venous thrombosis)   Obesity   PE (pulmonary embolism)   Seizures      Past Surgical History  Procedure Laterality Date   Hysterectomy   Hernia repair   Knee surgery  right   Tumor excision  right arm   Foot surgery  right   Rotator cuff repair   Mandible surgery   Oophorectomy      Patient  reports that she has never smoked. She does not have any smokeless  tobacco history on file. She reports that she does not drink alcohol or use  illicit drugs.      Previous Medications   CELECOXIB (CELEBREX) 200 MG CAPSULE    Take 200 mg by mouth 2 times a day.   CYCLOBENZAPRINE (FLEXERIL) 10 MG TABLET    Take 10 mg by mouth 3 times a day   as  needed.   DULOXETINE (CYMBALTA) 60 MG CAPSULE    Take 60 mg by mouth daily.   GABAPENTIN (NEURONTIN) 300 MG TABLET    Take 600 mg by mouth 2 times a day.   HYDROCODONE-ACETAMINOPHEN (NORCO) 5-325 MG PER TABLET    Take 1 tablet by   mouth  every 6 hours as  needed for Pain.   LIRAGLUTIDE (VICTOZA 2-PAK) 0.6 MG/0.1 ML (18 MG/3 ML) PNIJ    Inject 1.8 mg  subcutaneously daily.   OXYCODONE-ACETAMINOPHEN (PERCOCET) 5-325 MG PER TABLET    Take 1 tablet by  mouth every 6 hours as needed for Pain.   RAMIPRIL (ALTACE) 10 MG TABLET    Take 10 mg by mouth daily.      Allergies:  Allergies as of 10/27/2013 - Fully Reviewed 10/27/2013  Allergen Reaction Noted   Penicillins Hives 12/12/2011   Quinolones Hives 12/12/2011   Sulfa (sulfonamide antibiotics) Hives 12/12/2011      Review of Systems    Review of Systems  Constitutional:  Denies fever or chills  Eyes:  Denies change in  visual acuity  HENT:  Denies nasal congestion or sore throat  Respiratory:  See HPI  Cardiovascular:  See HPI  GI:  Denies abdominal pain, nausea, vomiting, bloody stools or diarrhea  GU:  Denies dysuria  Musculoskeletal:  Denies back pain or joint pain  Integument:  Denies rash  Neurologic:  Denies headache, focal weakness or sensory changes  Endocrine:  Denies polyuria or polydipsia  Lymphatic:  Denies swollen glands  Psychiatric:  Denies depression or anxiety    Physical Exam    ED Triage Vitals  Vital Signs Group     Temp 10/27/13 1151 98.2 deg F (36.8 deg C)     Temp Source 10/27/13 1151 Oral     Heart Rate 10/27/13 1151 93     Heart Rate Source 10/27/13 1151 Monitor     Resp 10/27/13 1151 15     BP 10/27/13 1151 166/100 mmHg     BP Location 10/27/13 1151 Right arm     BP Method 10/27/13 1151 Automatic     Patient Position 10/27/13 1151 Lying  SpO2 10/27/13 1151 94 %  O2 Device 10/27/13 1151 None (Room air)      ED Physical Exam  Constitutional:  Well developed, well nourished, no acute distress, non-toxic  appearance  Eyes:  PERRL, conjunctiva normal  HENT:  Atraumatic, external ears normal, nose normal, oropharynx moist, no  pharyngeal exudates. Neck- normal range of motion, no tenderness, supple  Respiratory:  No respiratory distress, normal breath sounds, no rales, no  wheezing  Cardiovascular:   Normal rate, normal rhythm, no murmurs, no gallops, no rubs  GI:  Soft, nondistended, normal bowel sounds, nontender, no organomegaly, no  mass, no rebound, no guarding  GU:  No costovertebral angle tenderness  Musculoskeletal:  No edema, no tenderness, no deformities. Back- no tenderness  Integument:  Well hydrated, no rash  Lymphatic:  No lymphadenopathy noted        Diagnostic Studies    Labs:    Please see electronic medical record for any tests performed in the ED    Radiology:    Please see electronic medical record for any tests performed in the ED    EKG:    EKG Interpretation    Interpreted by me    Rhythm: normal sinus  Rate: normal  Axis: normal  Ectopy: none  Conduction: normal  ST Segments: no acute change  T Waves: no acute change  Q Waves: none    Clinical Impression: no acute changes and normal EKG    Emergency Department Procedures    Procedures none    ED Course and MDM    [name] is a 43 y.o. female who presented to the emergency department  with Chest Pain and Shortness of Breath  The patient presented to the emergency department for evaluation of chest   pain.  The patient denies any fevers or chills.  She was complaining of dyspnea.  She  describes the pain and discomfort as if someone is sitting on her chest.  The  patient for her discomfort and pain was given lopressor IV push for blood  pressure control.  She received aspirin and nitroglycerin sublingual.  The  patient had an initial EKG obtained which showed normal sinus rhythm normal   axis  with no acute ischemic changes.  The patient had cardiac profile obtained, CBC  and renal.  The patient also had a chest x-ray pain.  The patient troponin and  BNP are negative.  Renal profile is normal.  Her d-dimer is negative.  The  patient has multiple risk factors for ACS and with her history will need to be  admitted for risk stratification for ACS.            Critical Care Time (Attendings)  None    Final impression is ACS    Disposition  is discharge to home        Cherly AndersonArthur Wall, MD  10/27/13 1310

## 2013-10-27 NOTE — Unmapped (Signed)
Pt to radiology with tech

## 2013-10-27 NOTE — Unmapped (Signed)
D: Pt came to the unit with chest pain. Pt is complaining of a headache.  A: Pt has been oriented to the room, educated on the fall risk policy. Vitals have been obtained, pt has been assessed. PRN pain medication was administered. Will continue to monitor pt.   R: Pt in bed with husband at bedside. Pt denies chest pain/other acute symptoms. CLWR, bed alarm on, bed at lowest.

## 2013-10-28 LAB — BASIC METABOLIC PANEL, FASTING
Anion Gap: 10 mmol/L (ref 3–16)
Anion Gap: 10 mmol/L (ref 3–16)
BUN: 20 mg/dL (ref 7–25)
BUN: 20 mg/dL (ref 7–25)
CO2: 23 mmol/L (ref 21–31)
CO2: 23 mmol/L (ref 21–31)
Calcium: 9.2 mg/dL (ref 8.6–10.3)
Calcium: 9.2 mg/dL (ref 8.6–10.3)
Chloride: 99 mmol/L (ref 98–110)
Chloride: 99 mmol/L (ref 98–110)
Creatinine: 0.73 mg/dL (ref 0.60–1.30)
Creatinine: 0.73 mg/dL (ref 0.60–1.30)
GFR African American: 105 See note.
GFR MDRD Af Amer: 105 See note.
GFR MDRD Non Af Amer: 87 See note.
GFR Non-African American: 87 See note.
Glucose: 277 mg/dL (ref 70–100)
Glucose: 277 mg/dL — ABNORMAL HIGH (ref 70–100)
Osmolality Calc: 287 mOsm/kg (ref 278–305)
Osmolality, Calculated: 287 mOsm/kg (ref 278–305)
Potassium: 4.3 mmol/L (ref 3.5–5.3)
Potassium: 4.3 mmol/L (ref 3.5–5.3)
Sodium: 132 mmol/L (ref 133–146)
Sodium: 132 mmol/L — ABNORMAL LOW (ref 133–146)

## 2013-10-28 LAB — POCT GLUCOSE MONITORING DEVICE
Glucose: 333 mg/dL — ABNORMAL HIGH (ref 65–99)
Glucose: 333 mg/dL — ABNORMAL HIGH (ref 65–99)
Glucose: 268 mg/dL — ABNORMAL HIGH (ref 65–99)
Glucose: 268 mg/dL — ABNORMAL HIGH (ref 65–99)

## 2013-10-28 LAB — CBC
Hematocrit: 39.3 % (ref 35.0–45.0)
Hematocrit: 39.3 % (ref 35.0–45.0)
Hemoglobin: 13.2 g/dL (ref 11.7–15.5)
Hemoglobin: 13.2 g/dL (ref 11.7–15.5)
MCH: 28.6 pg (ref 27.0–33.0)
MCH: 28.6 pg (ref 27.0–33.0)
MCHC: 33.6 g/dL (ref 32.0–36.0)
MCHC: 33.6 g/dL (ref 32.0–36.0)
MCV: 85.1 fL (ref 80.0–100.0)
MCV: 85.1 fL (ref 80.0–100.0)
MPV: 10.9 fL (ref 7.5–11.5)
MPV: 10.9 fL (ref 7.5–11.5)
Platelets: 191 10*3/uL (ref 140–400)
Platelets: 191 10E3/uL (ref 140–400)
RBC: 4.62 10*6/uL (ref 3.80–5.10)
RBC: 4.62 10E6/uL (ref 3.80–5.10)
RDW: 14.3 % (ref 11.0–15.0)
RDW: 14.3 % (ref 11.0–15.0)
WBC: 8.3 10*3/uL (ref 3.8–10.8)
WBC: 8.3 10E3/uL (ref 3.8–10.8)

## 2013-10-28 LAB — HEPATIC FUNCTION PANEL
ALT: 16 U/L (ref 7–52)
ALT: 16 U/L (ref 7–52)
AST: 12 U/L — ABNORMAL LOW (ref 13–39)
AST: 12 U/L — ABNORMAL LOW (ref 13–39)
Albumin: 3.9 g/dL (ref 3.5–5.7)
Albumin: 3.9 g/dL (ref 3.5–5.7)
Alkaline Phosphatase: 92 U/L (ref 34–104)
Alkaline Phosphatase: 92 U/L (ref 34–104)
Bilirubin, Direct: 0.1 mg/dL (ref 0.03–0.18)
Bilirubin, Direct: 0.1 mg/dL (ref 0.03–0.18)
Bilirubin, Indirect: 0.6 mg/dL (ref 0.20–0.70)
Bilirubin, Indirect: 0.6 mg/dL (ref 0.20–0.70)
Total Bilirubin: 0.7 mg/dL (ref 0.3–1.2)
Total Bilirubin: 0.7 mg/dL (ref 0.3–1.2)
Total Protein: 6.2 g/dL — ABNORMAL LOW (ref 6.4–8.9)
Total Protein: 6.2 g/dL — ABNORMAL LOW (ref 6.4–8.9)

## 2013-10-28 LAB — LIPID PROFILE (BARIATRIC)
Cholesterol, Total: 167 mg/dL (ref 0–200)
Cholesterol, Total: 182 mg/dL (ref 0–200)
HDL: 29 mg/dL (ref 23–92)
HDL: 31 mg/dL (ref 23–92)
LDL Calculated: 115 mg/dL — ABNORMAL HIGH (ref 0–100)
LDL Calculated: 99 mg/dL (ref 0–100)
Triglycerides: 178 mg/dL (ref 48–352)
Triglycerides: 196 mg/dL (ref 48–352)

## 2013-10-28 LAB — TROPONIN
Troponin I: <0.03 ng/mL (ref 0.00–0.03)
Troponin I: <0.03 ng/mL (ref 0.00–0.03)

## 2013-10-28 LAB — APTT
Coagulation Surface Induced: 36.4 seconds — ABNORMAL LOW (ref 90.0–130.0)
Coagulation Surface Induced: 62.1 seconds — ABNORMAL LOW (ref 90.0–130.0)

## 2013-10-28 LAB — MAGNESIUM
Magnesium: 1.8 mg/dL — ABNORMAL LOW (ref 1.9–2.7)
Magnesium: 1.8 mg/dL — ABNORMAL LOW (ref 1.9–2.7)

## 2013-10-28 LAB — LIPID PANEL
Cholesterol, Total: 182 mg/dL (ref 0–200)
Cholesterol, Total: 167 mg/dL (ref 0–200)
HDL: 31 mg/dL (ref 23–92)
HDL: 29 mg/dL (ref 23–92)
LDL Cholesterol: 115 mg/dL (ref 0–100)
LDL Cholesterol: 99 mg/dL (ref 0–100)
Triglycerides: 178 mg/dL (ref 48–352)
Triglycerides: 196 mg/dL (ref 48–352)

## 2013-10-28 LAB — POC GLU MONITORING DEVICE
POC Glucose Monitoring Device: 333 mg/dL (ref 65–99)
POC Glucose Monitoring Device: 268 mg/dL — ABNORMAL HIGH (ref 65–99)

## 2013-10-28 LAB — TROPONIN I: Troponin I: <0.03 ng/mL (ref 0.00–0.03)

## 2013-10-28 LAB — POC ACT-LR: Activated Clotting Time, Low Range POC: 156 s

## 2013-10-28 LAB — APTT-HEPARIN
hPTT: 36.4 s — ABNORMAL LOW (ref 90.0–130.0)
hPTT: 62.1 s — ABNORMAL LOW (ref 90.0–130.0)

## 2013-10-28 MED ORDER — insulin lispro (HUMALOG) 100 unit/mL injection
100 | Freq: Every evening | SUBCUTANEOUS | 2.00 refills | 30.00000 days | Status: AC
Start: 2013-10-28 — End: ?

## 2013-10-28 MED ORDER — atorvastatin (LIPITOR) 40 MG tablet
40 | ORAL_TABLET | Freq: Every evening | ORAL | Status: AC
Start: 2013-10-28 — End: ?

## 2013-10-28 MED ORDER — metoprolol tartrate (LOPRESSOR) partial tablet 12.5 mg
12.5 | Freq: Two times a day (BID) | ORAL | Status: AC
Start: 2013-10-28 — End: 2013-10-28
  Administered 2013-10-28: 13:00:00 12.5 mg via ORAL

## 2013-10-28 MED ORDER — ramipril (ALTACE) 5 MG capsule
5 | ORAL_CAPSULE | Freq: Every evening | ORAL | Status: AC
Start: 2013-10-28 — End: ?

## 2013-10-28 MED ORDER — oxyCODONE-acetaminophen (PERCOCET) 5-325 mg per tablet
5-325 | ORAL_TABLET | ORAL | Status: AC | PRN
Start: 2013-10-28 — End: ?

## 2013-10-28 MED ORDER — HEPARIN SODIUM,PORCINE (HEPARIN, PORCINE,) 5,000 unit/mL injection
5000 | Freq: Four times a day (QID) | INTRAMUSCULAR | Status: AC | PRN
Start: 2013-10-28 — End: ?

## 2013-10-28 MED ORDER — cyclobenzaprine (FLEXERIL) 10 MG tablet
10 | Freq: Three times a day (TID) | ORAL | Status: AC | PRN
Start: 2013-10-28 — End: ?

## 2013-10-28 MED ORDER — dextrose 50 % in water, D50W, Syrg iv
INTRAVENOUS | 0.00 refills | 8.00000 days | Status: AC | PRN
Start: 2013-10-28 — End: ?

## 2013-10-28 MED ORDER — morphine 2 mg/mL Crtg
2 | INTRAVENOUS | Status: AC | PRN
Start: 2013-10-28 — End: ?

## 2013-10-28 MED ORDER — metoprolol tartrate (LOPRESSOR) 25 MG tablet
25 | ORAL_TABLET | Freq: Two times a day (BID) | ORAL | Status: AC
Start: 2013-10-28 — End: ?

## 2013-10-28 MED ORDER — insulin lispro (HUMALOG) 100 unit/mL injection
100 | Freq: Three times a day (TID) | SUBCUTANEOUS | 2.00 refills | 30.00000 days | Status: AC
Start: 2013-10-28 — End: ?

## 2013-10-28 MED ORDER — heparin 25,000 units (IN 250 ML 0.45% SODIUM CHLORIDE) 25,000 unit/250 mL infusion
25000 | INTRAVENOUS | 0.00 refills | 30.00000 days | Status: AC
Start: 2013-10-28 — End: ?

## 2013-10-28 MED ORDER — acetaminophen (TYLENOL) 325 MG tablet
325 | ORAL_TABLET | ORAL | 0.00 refills | 11.00000 days | Status: AC | PRN
Start: 2013-10-28 — End: ?

## 2013-10-28 MED ORDER — nitroGLYCERIN (NITROSTAT) 0.4 MG SL tablet
0.4 | ORAL_TABLET | SUBLINGUAL | Status: AC | PRN
Start: 2013-10-28 — End: ?

## 2013-10-28 MED ORDER — aspirin 325 MG EC tablet
325 | ORAL_TABLET | Freq: Every day | ORAL | 1.00 refills | 30.00000 days | Status: AC
Start: 2013-10-28 — End: ?

## 2013-10-28 MED ORDER — ondansetron (ZOFRAN) 4 mg/2 mL Soln injection
4 | INTRAMUSCULAR | Status: AC | PRN
Start: 2013-10-28 — End: ?

## 2013-10-28 MED FILL — METOPROLOL TARTRATE 12.5 MG ORAL DOSE: 12.5 12.5 MG | ORAL | Qty: 1

## 2013-10-28 MED FILL — LANTUS U-100 INSULIN 100 UNIT/ML SUBCUTANEOUS SOLUTION: 100 100 unit/mL | SUBCUTANEOUS | Qty: 0.2

## 2013-10-28 MED FILL — CYMBALTA 60 MG CAPSULE,DELAYED RELEASE: 60 60 mg | ORAL | Qty: 1

## 2013-10-28 MED FILL — NITRO-BID 2 % TRANSDERMAL OINTMENT: 2 2 % | TRANSDERMAL | Qty: 1

## 2013-10-28 MED FILL — ASPIRIN 325 MG TABLET,DELAYED RELEASE: 325 325 MG | ORAL | Qty: 1

## 2013-10-28 MED FILL — OXYCODONE-ACETAMINOPHEN 5 MG-325 MG TABLET: 5-325 5-325 mg | ORAL | Qty: 1

## 2013-10-28 MED FILL — MORPHINE 2 MG/ML INTRAVENOUS CARTRIDGE: 2 2 mg/mL | INTRAVENOUS | Qty: 1

## 2013-10-28 MED FILL — HEPARIN (PORCINE) 5,000 UNIT/ML INJECTION SOLUTION: 5000 5,000 unit/mL | INTRAMUSCULAR | Qty: 1

## 2013-10-28 NOTE — Unmapped (Signed)
D: Pt c/o 8/10 headache type pain, not able to be relieved by tylenol or morphine.  A: Given prn percocet as requested by patient. See MAR.  R: Will continue to monitor.     Jarah Pember ANN Glean Salvo , RN

## 2013-10-28 NOTE — Unmapped (Signed)
[facility] HEART CONSULT    Referring Physician: Dr. [place]      Reason for Consult: Unstable angina    Chief Complaint: Chest pain    History of Present Illness:   43yo white female with a history of DM, DVT/PE, HTN and arthritis presented to the [facility] ER for further evaluation of chest pain. She had been in her normal state of health until Saturday evening when she developed chest pain. Her first episode occurred while in the shower. She described her pain as a pressure sensation without radiation, but she was dyspneic. When she was able to sit down, her symptoms resolved. However, whenever she would exert herself thereafter, she developed severe substernal chest pain once again without radiation, but she was again dyspneic. These symptoms resolved with rest. She denied nausea, emesis, palpitations, and diaphoresis, but she did feel dizzy/lightheaded without syncope. She denies any previous cardiac work-up, and she has had no orthopnea, PND, or peripheral edema. Her only complaint currently is a headache from the nitroglycerin paste.     History:  Past Medical History   Diagnosis Date   ??? Arthritis    ??? Diabetes mellitus    ??? DVT (deep venous thrombosis)    ??? Obesity    ??? PE (pulmonary embolism)    ??? Seizures    ??? PE (pulmonary embolism)      Past Surgical History   Procedure Laterality Date   ??? Hysterectomy     ??? Hernia repair     ??? Knee surgery  right   ??? Tumor excision  right arm   ??? Foot surgery  right   ??? Rotator cuff repair     ??? Mandible surgery     ??? Oophorectomy       Family History   Problem Relation Age of Onset   ??? Migraines Mother    ??? Migraines Sister    ??? Migraines Maternal Aunt    ??? Diabetes Maternal Uncle    ??? Diabetes Maternal Grandmother    ??? Other Maternal Grandmother      SLE   ??? Dementia Maternal Grandfather    ??? Diabetes Paternal Grandmother    ??? Dementia Paternal Grandfather    ??? Seizures Son 2     Febrile seizures     History   Substance Use Topics   ??? Smoking status: Never Smoker    ??? Smokeless  tobacco: Not on file   ??? Alcohol Use: No     Allergies   Allergen Reactions   ??? Penicillins Hives   ??? Quinolones Hives   ??? Sulfa (Sulfonamide Antibiotics) Hives       Prior to Admission Meds:  Prescriptions prior to admission   Medication Sig Dispense Refill   ??? celecoxib (CELEBREX) 200 MG capsule Take 200 mg by mouth 2 times a day.       ??? DULoxetine (CYMBALTA) 60 MG capsule Take 60 mg by mouth daily.       ??? gabapentin (NEURONTIN) 300 MG tablet Take 600 mg by mouth 2 times a day.        ??? insulin glargine (LANTUS) 100 unit/mL injection Inject 20 Units subcutaneously at bedtime.       ??? INSULIN LISPRO (HUMALOG KWIKPEN SUBQ) Inject subcutaneously. Sliding scale before meals maximum 16 units       ??? lansoprazole (PREVACID) 30 MG capsule Take 30 mg by mouth every morning before breakfast.       ??? levothyroxine (SYNTHROID, LEVOTHROID) 100  MCG tablet Take 100 mcg by mouth daily.       ??? ramipril (ALTACE) 5 MG capsule Take 5 mg by mouth daily.           Review of systems:  As above with all other review of systems negative    Vitals:  Blood pressure 113/78, pulse 73, temperature 98.3 ??F (36.8 ??C), temperature source Oral, resp. rate 18, height 5' 7 (1.702 m), weight 322 lb (146.058 kg), SpO2 93.00%.    GEN: WD/WN young white female in NAD  SKIN: Clean, dry, intact; No bleeding, bruising, rash  HEENT: Normal; PERRL, EOM intact B/L  NECK: Supple, No JVD, No carotid bruits  CV: RRR, NL S1 and S2, No M/G/R  LUNGS: CTA B/L, non-labored respirations  ABD: BS+, soft, no tenderness to palpation; no masses, no organomegaly  EXT: No C/C/E; Pulses 2+ B/L  MUSCULOSKELETAL: No joint deformities  PSYCH: A&O x 3  NEURO: CN II-XII intact       Labs:  Lab Results   Component Value Date    TROPONINI <0.03 10/28/2013    BNP 12 10/27/2013     Lab Results   Component Value Date    GLUCOSE 344* 10/27/2013    BUN 20 10/27/2013    CREATININE 0.75 10/27/2013    K 4.5 10/27/2013    NA 133 10/27/2013    CL 100 10/27/2013    CALCIUM 9.5 10/27/2013     Lab  Results   Component Value Date    WBC 8.3 10/28/2013    HGB 13.2 10/28/2013    HCT 39.3 10/28/2013    MCV 85.1 10/28/2013    PLT 191 10/28/2013       EKG as reviewed by me: Normal sinus rhythm with diffuse non-specific ST/T wave changes    CXR: No acute cardiopulmonary disease        Assessment:  1. Unstable angina  2. Abnormal EKG  3. HTN  4. DM  5. H/O DVT/PE  6. Arthritis    Plan:  1. Her symptoms are extremely concerning for progressive angina. With any degree of exertion, she develops her angina which is relieved with rest. Given her progressive, stuttering symptoms, I recommended that she undergo further evaluation with a left heart catheterization. I discussed the risks, benefits, and indications for the procedure along with CPORT enrollment. She voiced understanding and agrees to proceed, but she has requested that this be performed at the [facility] which I have arranged with my partner, Dr. Reola Calkins.  2. Continue ASA, Lipitor, and Altace.  3. Check FLP  4. Lopressor 12.5mg  BID  5. Continue IV heparin  6. D/C nitroglycerin paste due to headache.    Thank you for allowing me to participate in the care of your patient. Please call with any further questions or concerns.            [facility] Card[facility] pager for [facility]:  Dial [phone], press 9

## 2013-10-28 NOTE — Unmapped (Signed)
D-   Patient to be transferred to [facility]  For angiogram. Npo. Meds given including aspirin. Insulin coverage of 3 units given for blood sugar of 268. Morphine 2 mg given for headache. Nitropaste wiped off.

## 2013-10-28 NOTE — Unmapped (Signed)
[facility]  Discharge Summary      Patient's PCP:  Wynema Birch, MD  Name: Tamara Cox  DOB: 1971/04/10  MRN: [id number]     Admit Date: 10/27/2013   Discharge Date: 10/28/2013    Admitting Physician:  Cleve Paolillo [place], MD  Discharge Physician:  Daijah Scrivens [place], MD  Consults:   Cardiology [facility]),  Dr. Greggory Stallion       Primary Diagnosis:   chest pain with concern for unstable angina    Secondary Diagnosis:   Past Medical History   Arthritis    Diabetes mellitus    DVT (deep venous thrombosis)    Obesity    PE (pulmonary embolism)- 2011    Seizures- last year, isolated incident, not on treatment          History of Present Illness:  Tamara Cox is a 43 year old female who presented to the emergency department for evaluation of substernal chest pain. The patient developed substernal chest pain and shortness of breath yesterday evening while she was in the shower. She describes it as a pressure-like heaviness, as if someone is sitting on her chest. It was associated with a feeling like she might pass out. No nausea, no diaphoresis. She had to get out of the shower. Subsequently was developing similar chest pain with minimal exertion, even with brushing her teeth. The patient states her pain resolves at rest. She denies any flank pain or back pain. She denies any extremity edema. She has had no PND or orthopnea. The patient denies any fevers or chills. She denies any night sweats. Pain was relieved with nitroglycerin in ED, although now she has a whopping headache.     Initially stated that she had been feeling in her normal state of health, but then recalls that she had had an episode of diaphoresis last week while she was singing in choir. No other associated symptoms at that time, however.    Brief Hospital Course:     1.  CP, concern for unstable angina:  Although EKG and troponins were negative, patient's symptoms were felt to be extremely concerning for progressive angina.    Given her progressive, stuttering symptoms,  Dr. Greggory Stallion recommended that she undergo further evaluation with a left heart catheterization.   Patient  requested that this be performed at the [facility], and transfer was arranged, accepting physician Dr. Reola Calkins.  Was continued on ASA, Lipitor, IV heparinand Altace at transfer.  Fasting lipid panel was pending at time of transfer.  Was started on Lopressor 12.5mg  BID.  Had initally been started on nitroglycerin paste, but this was discontinued due to headache.  2.  DMII:  Patient was continued on home lantus.  Received just low dose SSI due to NPO status.  3.  HTN:  As above.    Physical Exam on day of hospital discharge:     BP 140/94   Pulse 69   Temp(Src) 97.8 ??F (36.6 ??C) (Oral)   Resp 16   Ht 5' 7 (1.702 m)   Wt 322 lb (146.058 kg)   BMI 50.42 kg/m2   SpO2 94%    Gen:  AAO x 3, NAD  CV:  RRR, No MRG  Lungs: CTA bilaterally  Abd:  S, NT, ND, BS+, no HSM  Ext:  Trace edema      Labs prior to hospital discharge:    Recent Labs      10/27/13   1229  10/27/13   1831  10/28/13   0646   WBC  7.3  8.0  8.3   HGB  14.9  13.5  13.2   HCT  44.4  39.9  39.3   PLT  207  190  191                                                                  Recent Labs      10/27/13   1229  10/28/13   0646   NA  133  132*   K  4.5  4.3   CL  100  99   CO2  21  23   BUN  20  20   CREATININE  0.75  0.73   GLUCOSE  344*  277*       Diagnostic Studies:     X-ray Chest Pa And Lateral    10/27/2013   HISTORY chest pain  PA and lateral chest  Heart size and vascularity within normal limits. No acute infiltrates. Mild elevation right hemidiaphragm. Appearance similar to prior study 03/04/2010  IMPRESSION  No evidence of acute cardiopulmonary disease  Report Verified by: Fransico Meadow, MD at 10/27/2013 12:49 PM         Medications at time of hospital transfer:   Current Facility-Administered Medications   Medication Dose Frequency Provider Last Dose   ??? acetaminophen  650 mg Q4H PRN  Azaela Caracci [place], MD     ??? aspirin  325 mg Daily with breakfast Teisha Trowbridge [place], MD 325 mg at 10/28/13 1610   ??? atorvastatin  40 mg Nightly (2100) Marvin Grabill [place], MD 40 mg at 10/27/13 2053   ??? cyclobenzaprine  10 mg TID PRN Kc Sedlak [place], MD     ??? dextrose 50 % in water (D50W)  25-50 mL Q15 Min PRN Royalty Domagala [place], MD     ??? DULoxetine  60 mg Daily 0900 Amery Vandenbos [place], MD 60 mg at 10/28/13 9604   ??? gabapentin  600 mg BID Sesar Madewell [place], MD 600 mg at 10/27/13 2053   ??? heparin (porcine)  4,000 Units Q6H PRN Kanisha Duba [place], MD      And   ??? heparin (porcine)  4,000 Units Q6H PRN Kenechukwu Eckstein [place], MD 4,000 Units at 10/28/13 0239   ??? heparin 25,000 units  12 Units/kg/hr (Order-Specific) Continuous Siarah Deleo [place], MD 16 Units/kg/hr at 10/28/13 0239   ??? insulin glargine  20 Units Nightly (2100) Bram Hottel [place], MD 20 Units at 10/27/13 2053   ??? insulin lispro  0-4 Units Nightly (2100) Jocelyne Reinertsen [place], MD 3 Units at 10/27/13 2214   ??? insulin lispro  0-5 Units TID [facility] Jenniferlynn Saad [place], MD 3 Units at 10/28/13 5409   ??? metoprolol tartrate  12.5 mg BID Martin Majestic, MD 12.5 mg at 10/28/13 8119   ??? morphine  2-4 mg Q3H PRN Kam Kushnir [place], MD 2 mg at 10/28/13 0902   ??? nitroGLYCERIN  0.4 mg Q5 Min PRN Cherly Anderson, MD 0.4 mg at 10/27/13 1237   ??? ondansetron  4 mg Q4H PRN Windsor Goeken [place], MD     ??? oxyCODONE-acetaminophen  1 tablet Q4H PRN Denard Tuminello [place], MD 1 tablet at 10/28/13 0426   ??? ramipril  5 mg DAILY 2100 Christpher Stogsdill [place], MD 5 mg at 10/27/13 2215  Disposition:  Transferred to [facility]       Total time spent on this discharge was 20 minutes including time spent with the patient, reviewing data, completing medical records,  communication with the patient, and arranging transfer to accepting facility.        Nora Rooke [place], MD  [facility]

## 2013-10-28 NOTE — Unmapped (Signed)
D-  [facility]p. Report has been called to [facility]

## 2014-02-15 LAB — TSH: TSH: 3.33 u[IU]/mL (ref 0.27–4.20)

## 2020-12-03 IMAGING — CR XR shoulder complete LT
3 series · 3 of 3 positions shown · non-contrast
Comparison: None

Procedure(s): XR shoulder complete LT
PROCEDURE:

XR SHOULDER 3-V Left
HISTORY: Generalized left shoulder pain after fall
TECHNIQUE: 3 views of the left shoulder are submitted.

[AP (1 of 2)]
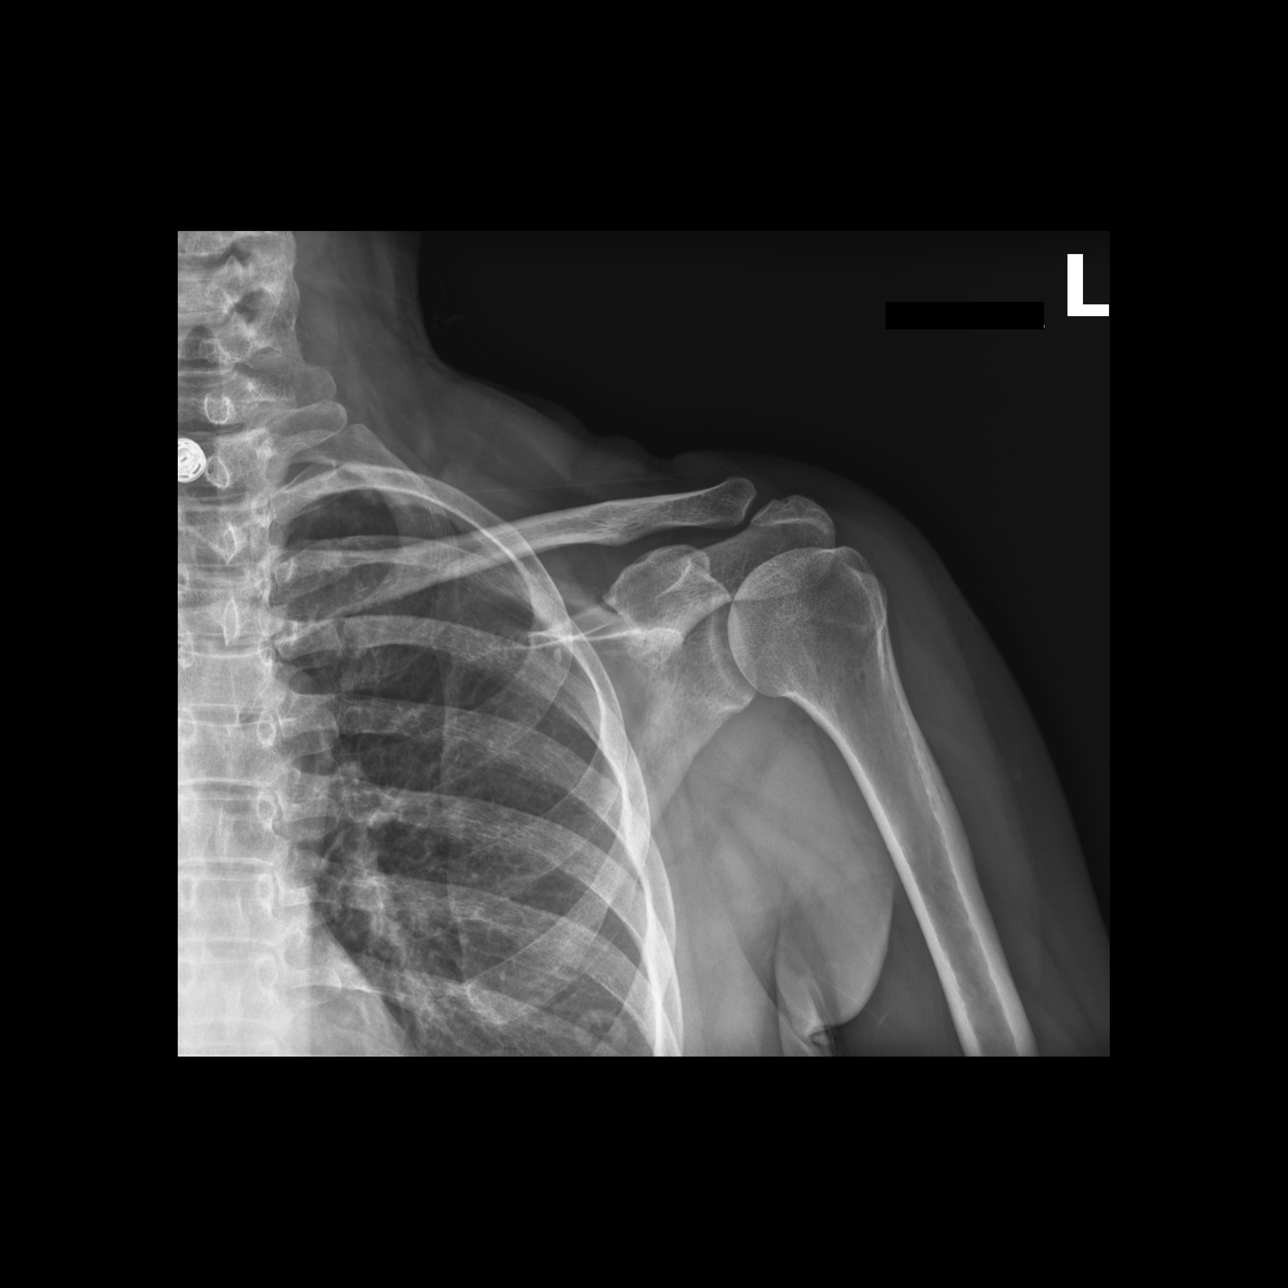

[AP (2 of 2)]
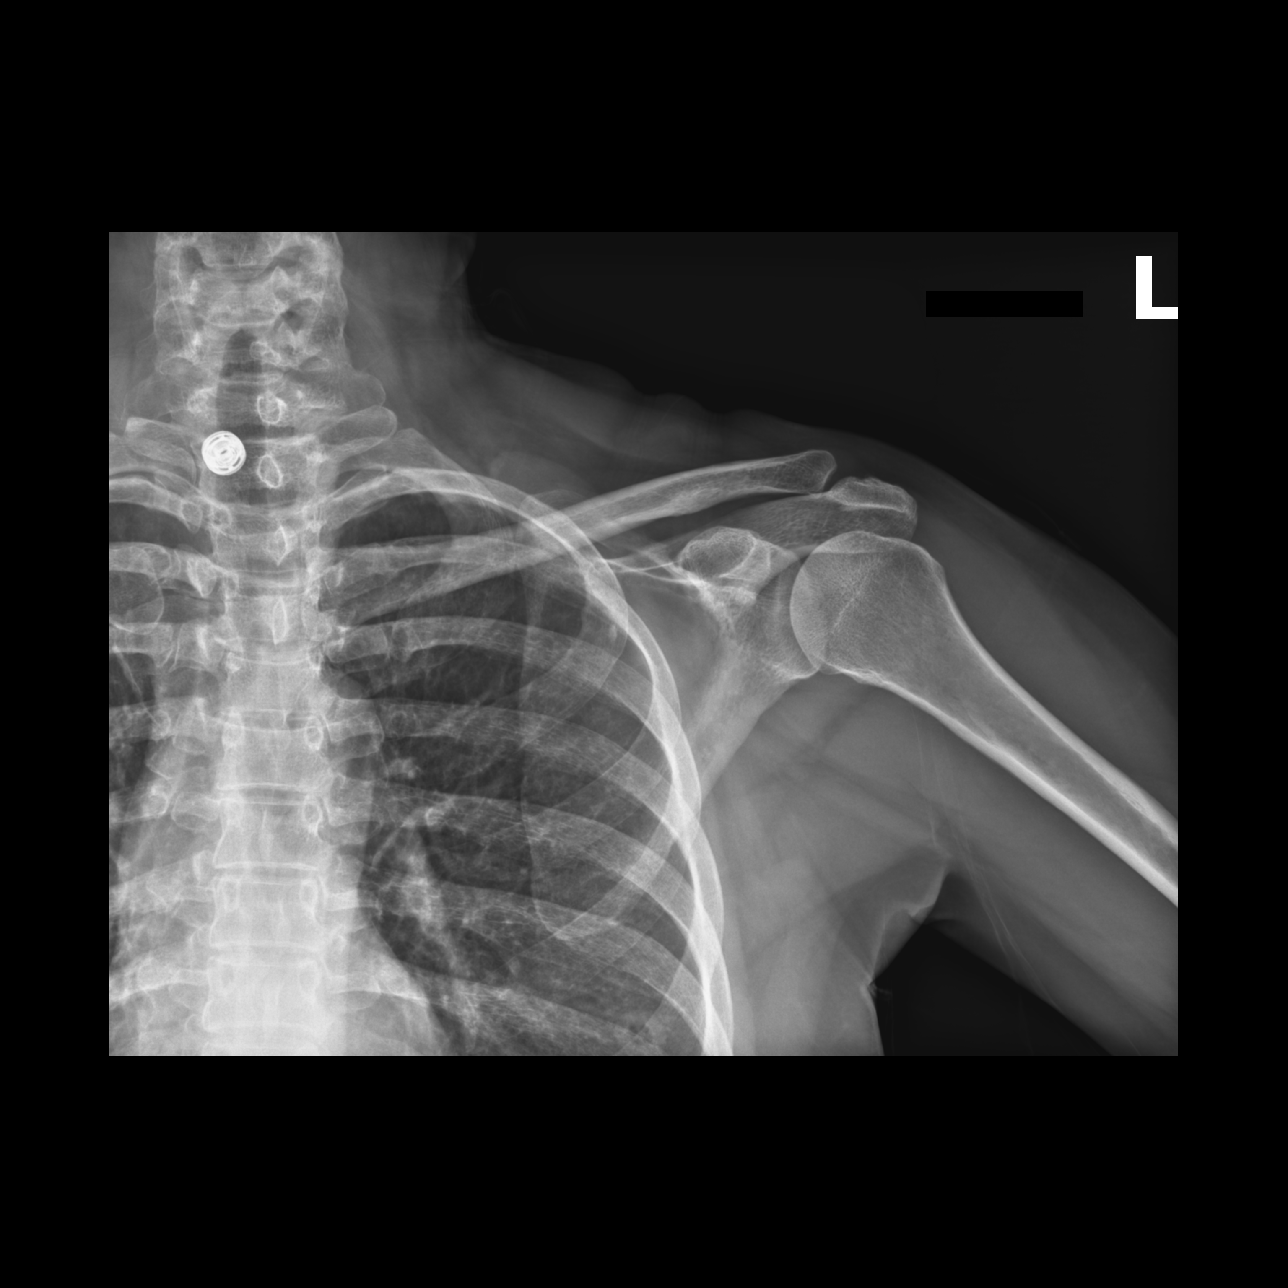

[lateral]
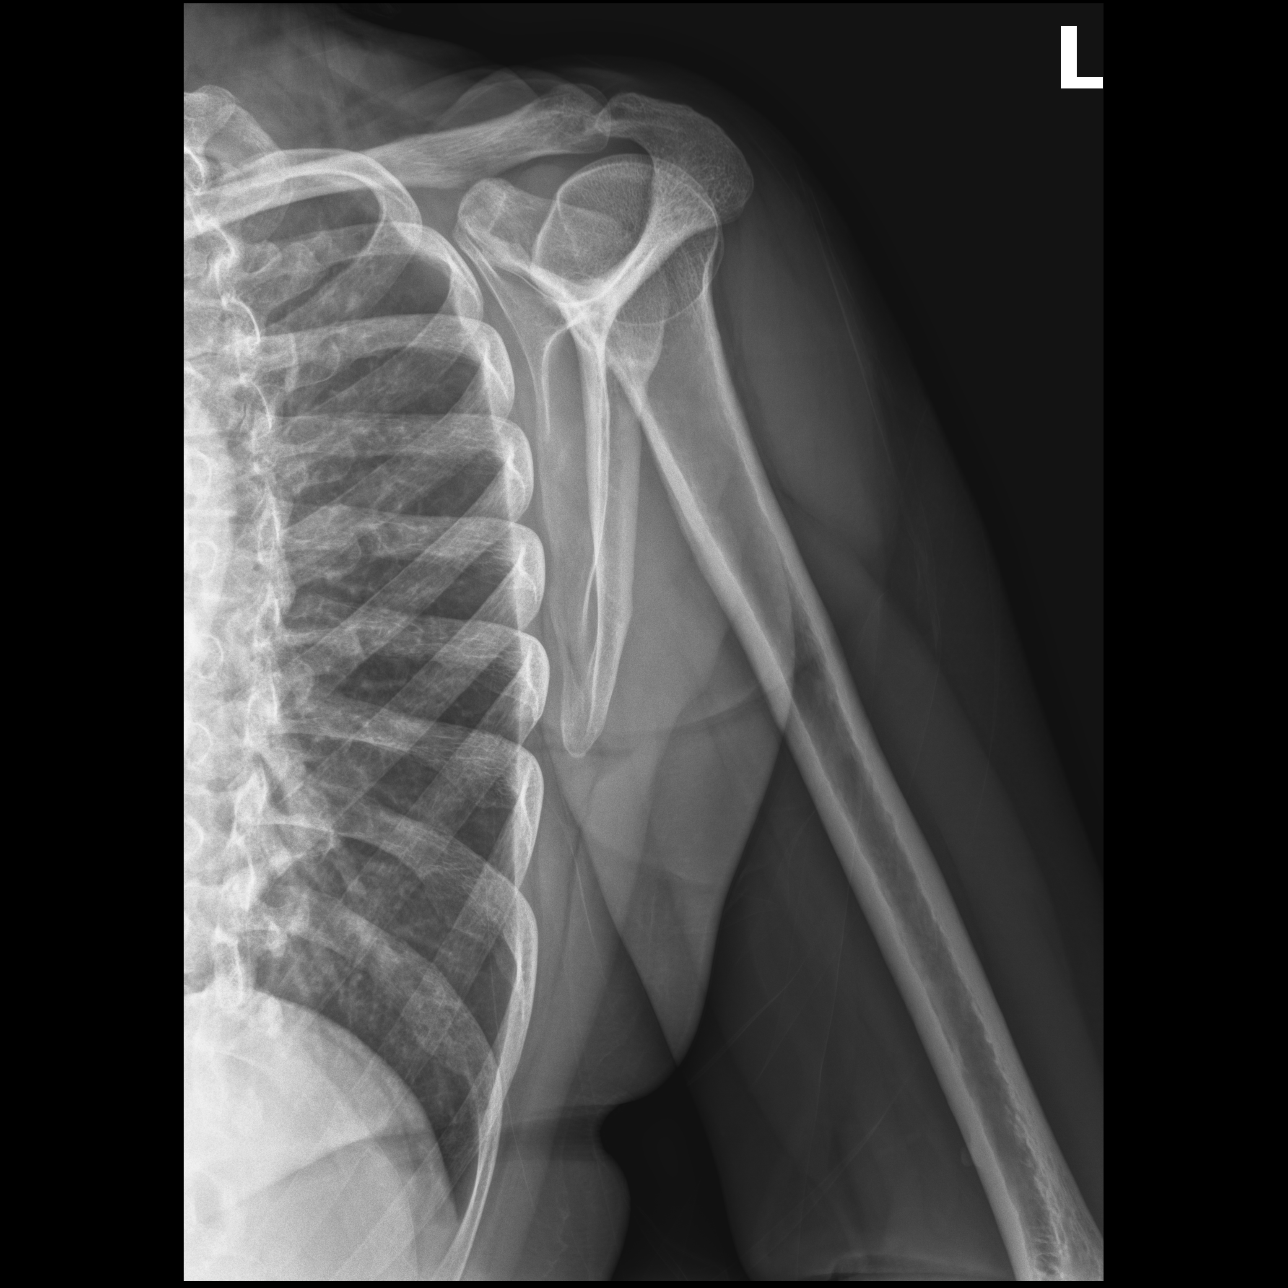

[3 of 3 positions shown; findings below may reference images not displayed]

FINDINGS: The visualized bony structures are unremarkable.
IMPRESSION: Normal appearing left shoulder. If there is concern for internal derangement, a MRI study may be
helpful.

## 2020-12-03 IMAGING — CR XR elbow complete LT
3 series · 3 of 3 positions shown · non-contrast
Comparison: None.

Procedure(s): XR elbow complete LT
PROCEDURE:

XR ELBOW JOINT 3-V Left
HISTORY: Posterior elbow pain; abrasions from fall.
TECHNIQUE: 3 Views of the left elbow are submitted.

[AP]
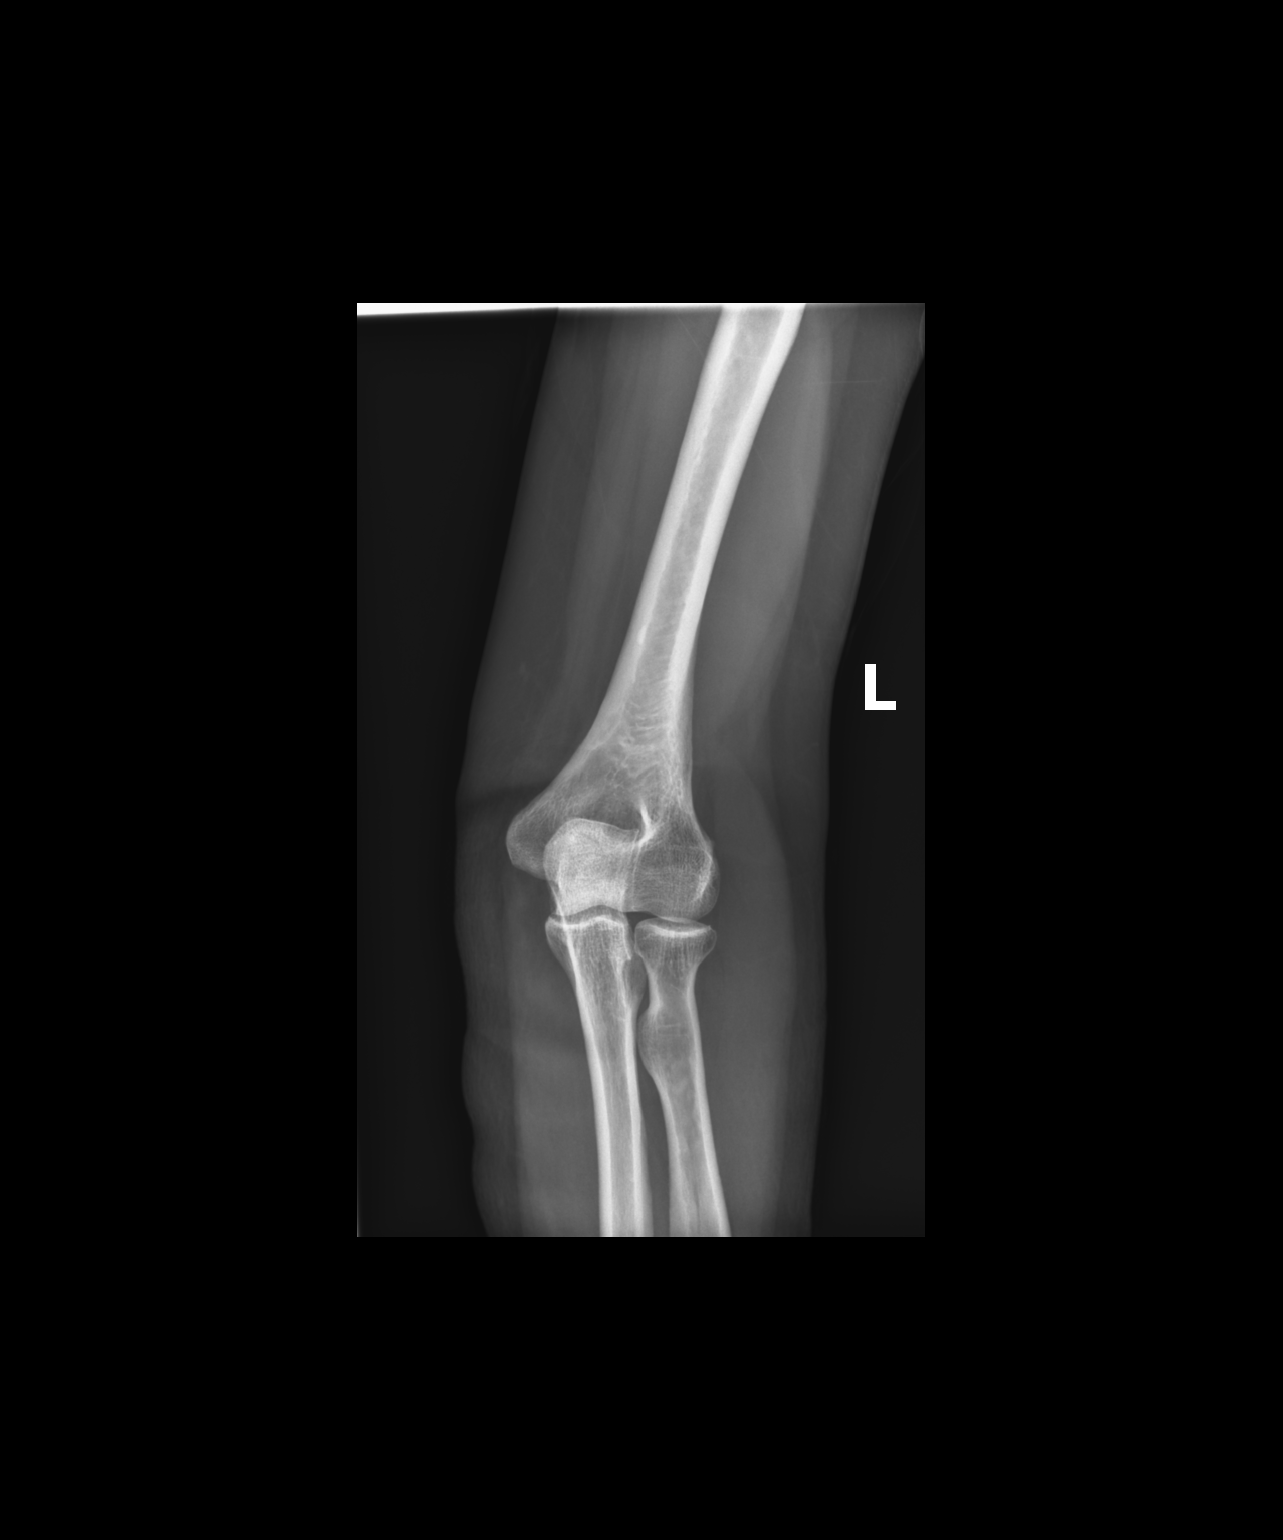

[oblique]
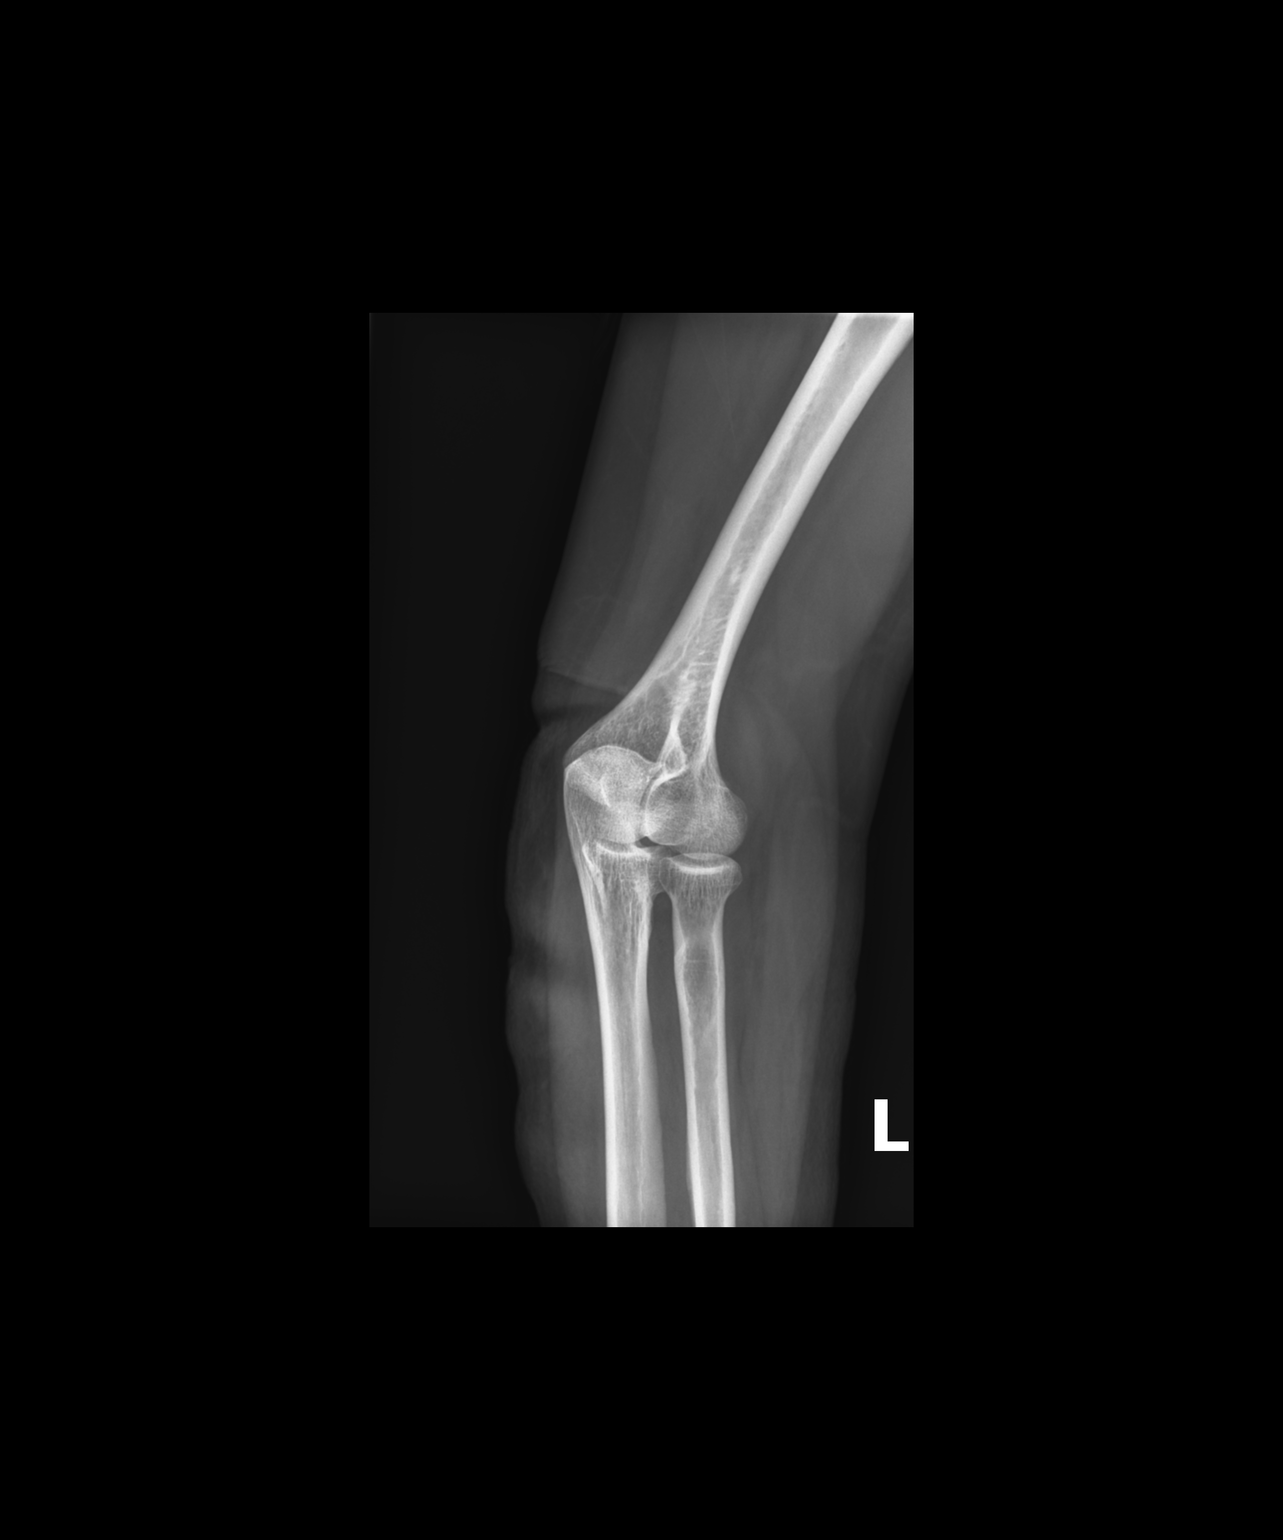

[lateral]
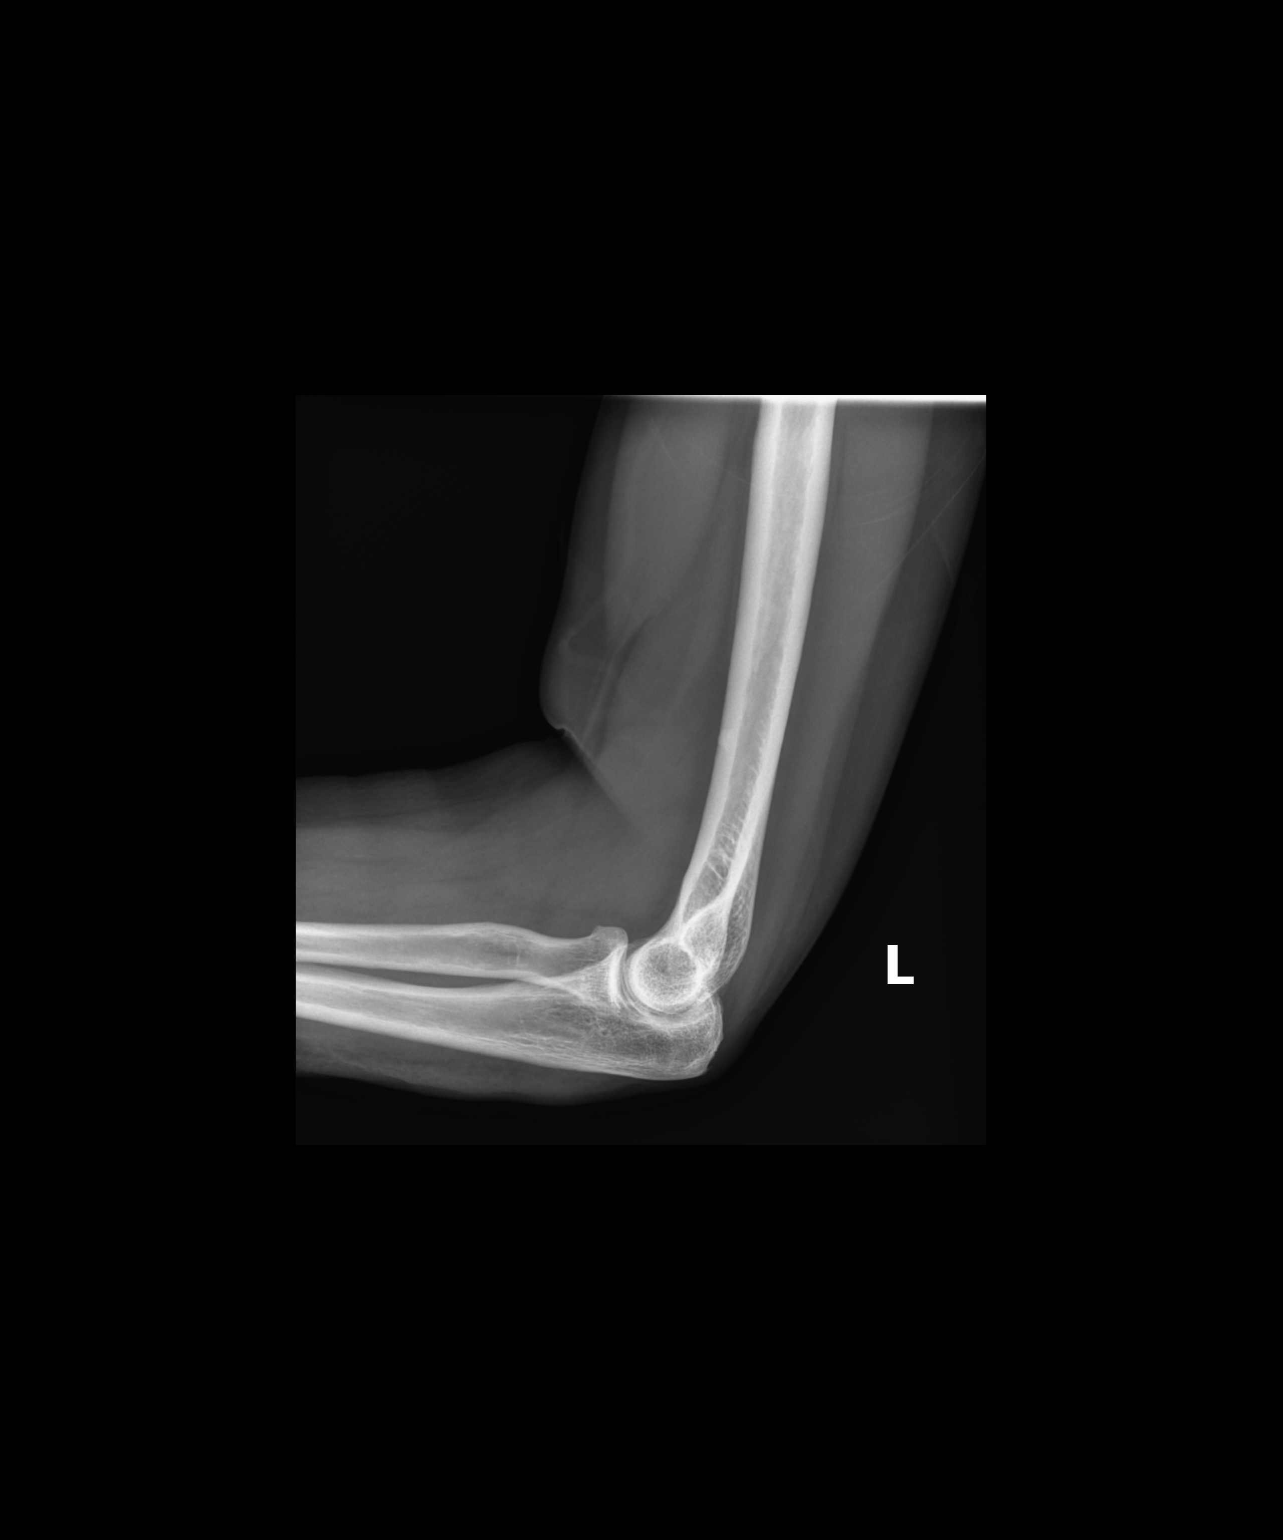

[3 of 3 positions shown; findings below may reference images not displayed]

FINDINGS: The visualized bony structures are unremarkable.  There is no joint effusion.
IMPRESSION: 1.  Unremarkable left elbow x-rays.  Consider correlation with MRI to better evaluate if there
remains clinical concern.

## 2020-12-08 IMAGING — MR MR elbow LT wo con
4 of 6 series · 19 of 40 positions shown · non-contrast
Comparison: Radiographs from December 03, 2020

Procedure(s): MR elbow LT wo con
PROCEDURE:

MRI ELBOW  W/O CONTRAST Left
HISTORY: Left elbow pain after fall one week ago.
TECHNIQUE: Multisequence, multiaxial noncontrasted MRI examination of the left elbow was performed.

[Series 3: T1 · axial · 4.0mm · 0.29mm/px · z∈[-41,+68]mm · 3 of 36 slices shown (1 of 2)]
[im 5/36]
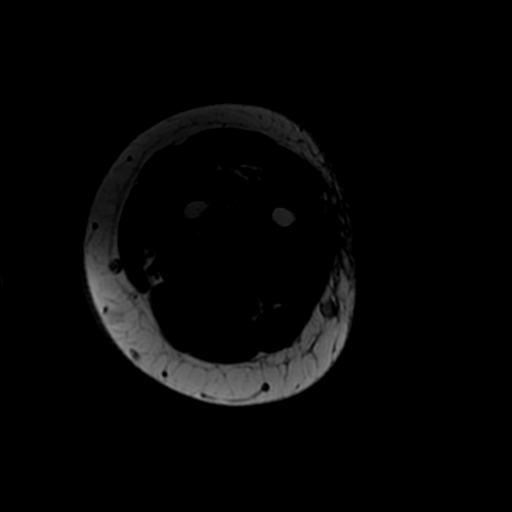
[im 18/36]
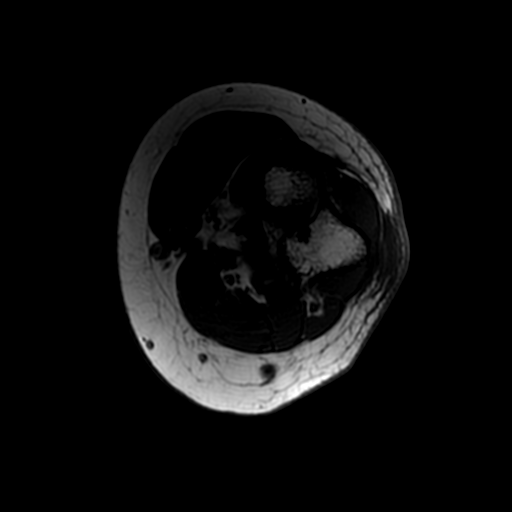
[im 31/36]
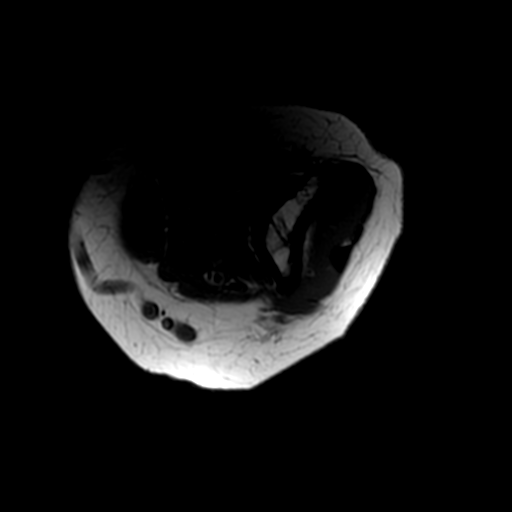

[Series 4: PD fat-sat · axial · 4.0mm · 0.29mm/px · z∈[-58,+89]mm · 9 of 36 slices shown (1 of 2)]
[im 1/36]
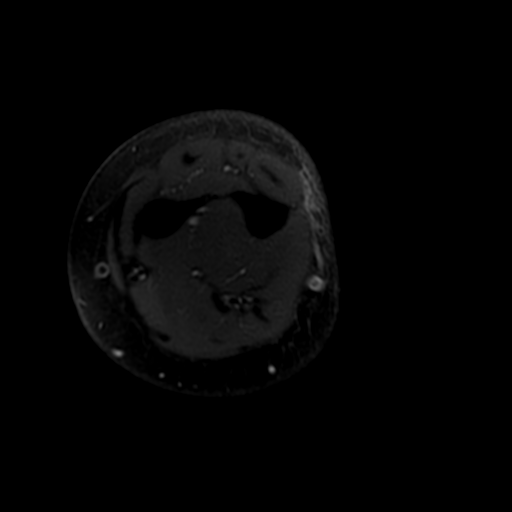
[im 5/36]
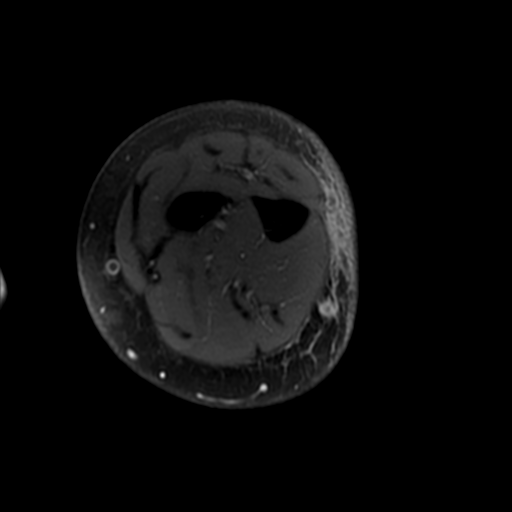
[im 9/36]
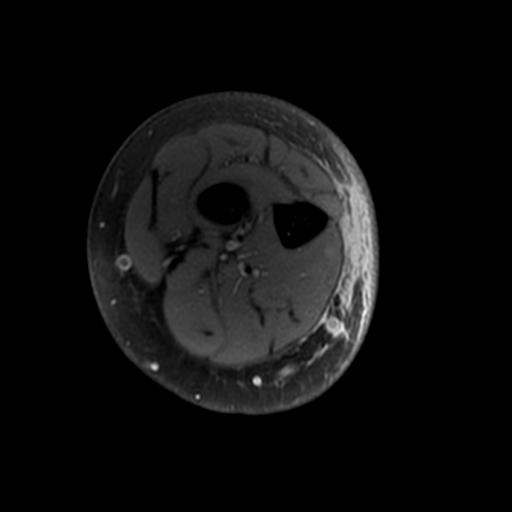
[im 14/36]
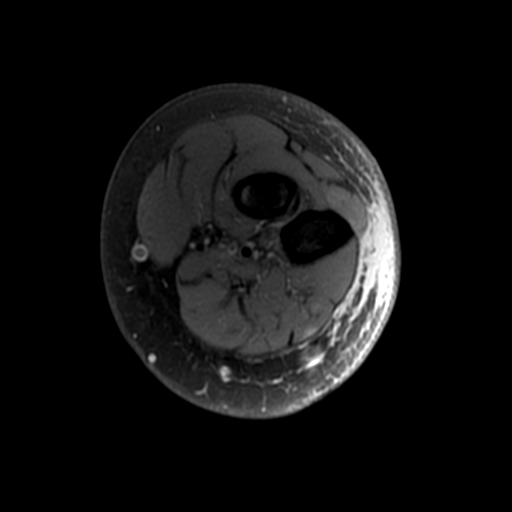
[im 18/36]
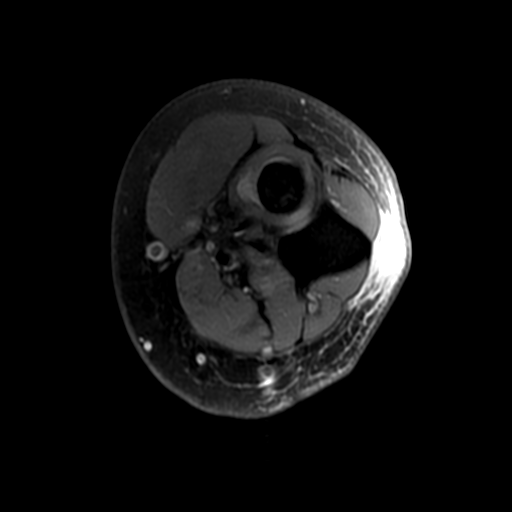
[im 22/36]
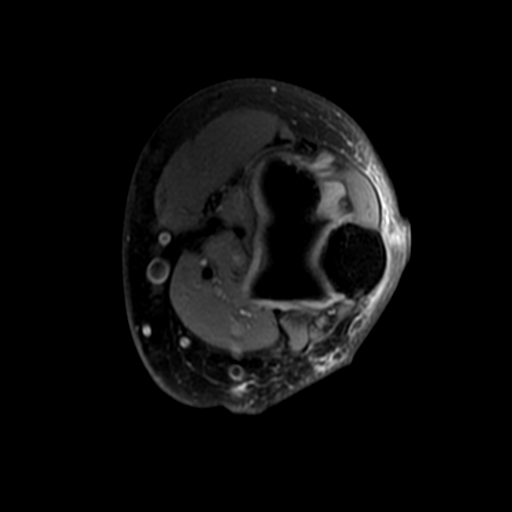
[im 27/36]
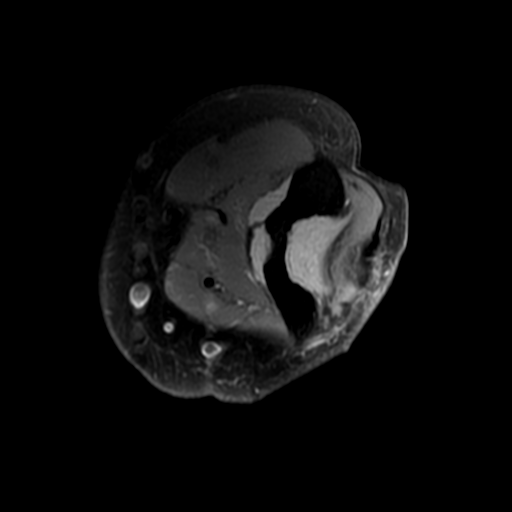
[im 31/36]
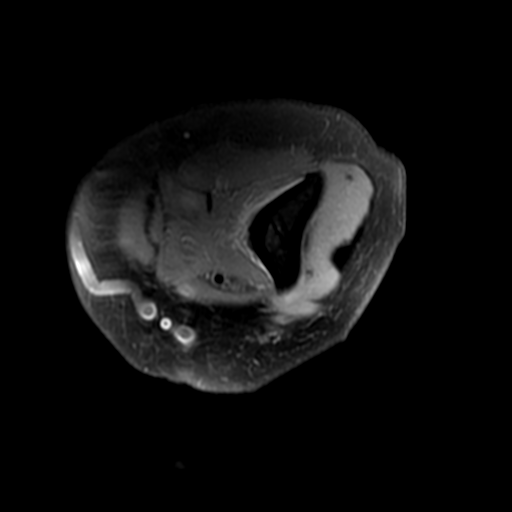
[im 36/36]
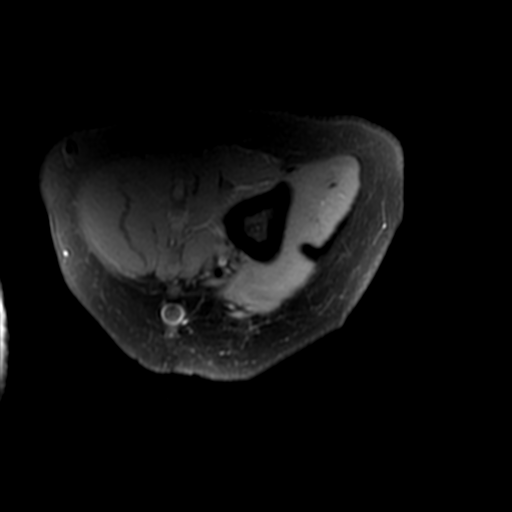

[Series 5: T1 · oblique · 3.5mm · 0.31mm/px · 3 of 22 slices shown (2 of 2)]
[im 1/22]
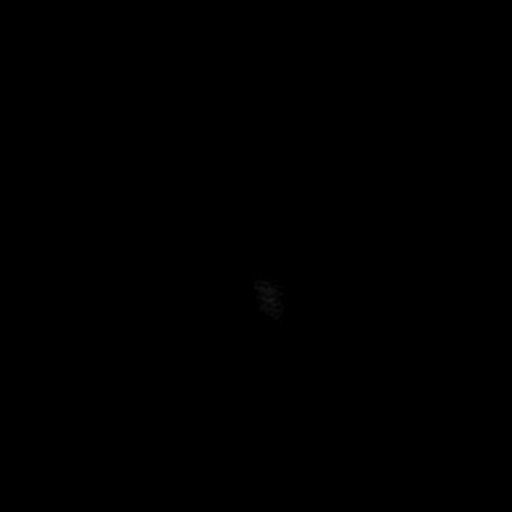
[im 11/22]
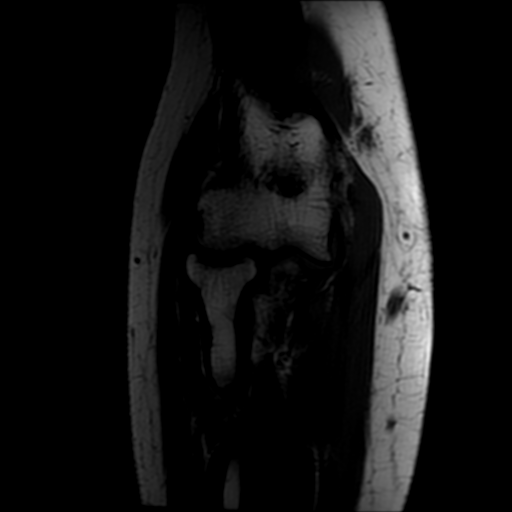
[im 22/22]
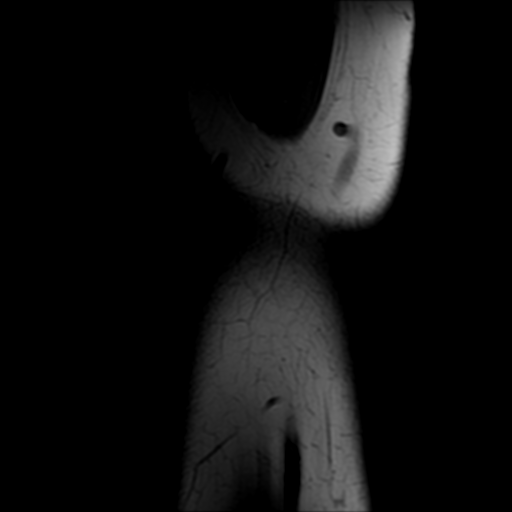

[Series 6: PD fat-sat · oblique · 3.5mm · 0.31mm/px · 4 of 22 slices shown (2 of 2)]
[im 1/22]
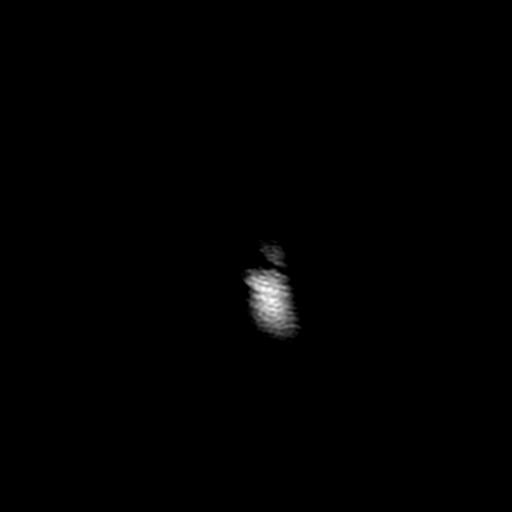
[im 6/22]
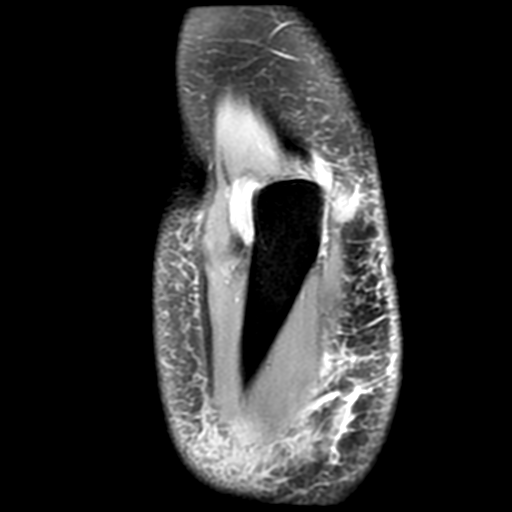
[im 11/22]
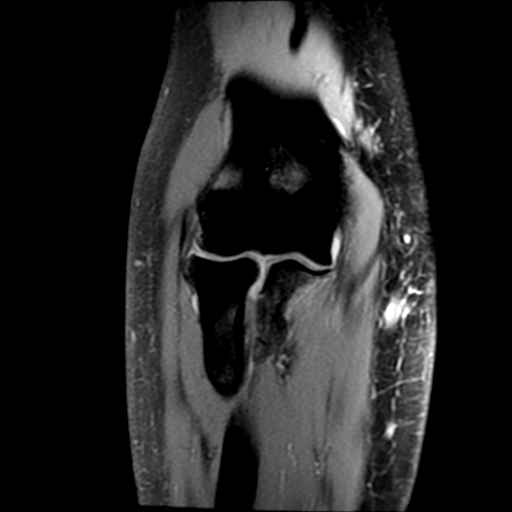
[im 22/22]
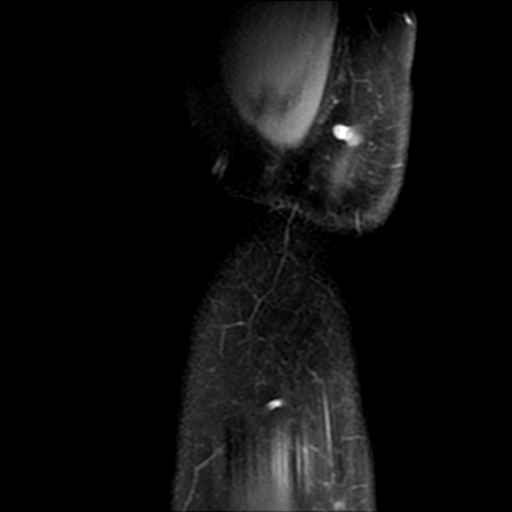

[19 of 40 positions shown; findings below may reference images not displayed]

FINDINGS: Joint effusion: None

Medial compartment:

Ulnar collateral ligament: Normal

Common flexor tendon: Normal

Medial epicondyle: Normal

Lateral compartment:

Radial collateral ligament: Normal

Lateral ulnar collateral ligament: Normal

Common extensor tendon: Normal

Lateral epicondyle: Normal

Posterior compartment:

Triceps: Normal

Olecranon: Normal

Bursitis: Normal

Anterior compartment:

Biceps: Normal

Brachialis: Normal

Bicipitoradial bursa: Normal

Articulations:

Radio-capitellar joint: Normal

Ulno-humeral joint: Normal

Proximal radio-ulnar joint: Normal

Other
FINDINGS: Bones (other than subarticular marrow): Normal

Muscles: Normal

Vessels: Normal

Nerves: Normal

Anconeus epitrochlearis muscle: Normal

Intra-articular bodies: None

Soft tissue: Soft tissue edema within the posterior proximal forearm.
IMPRESSION: 1.  No acute findings within the left elbow.

2.  Intact ligaments.

3.  No evidence of joint effusion.

4.  Normal cartilage.

5.  Soft tissue edema within the posterior proximal forearm.

## 2020-12-08 IMAGING — MR MR shoulder LT wo con
4 of 6 series · 23 of 40 positions shown · non-contrast
Comparison: Radiographs from December 03, 2020

Procedure(s): MR shoulder LT wo con
PROCEDURE:

MRI SHOULDER W/O CONTRAST
HISTORY: Left shoulder pain after fall
TECHNIQUE: Multisequence, multiaxial noncontrasted MRI examination of the left shoulder was performed.

[Series 3: T1 · axial · 3.5mm · 0.31mm/px · z∈[-40,+18]mm · 3 of 23 slices shown]
[im 4/23]
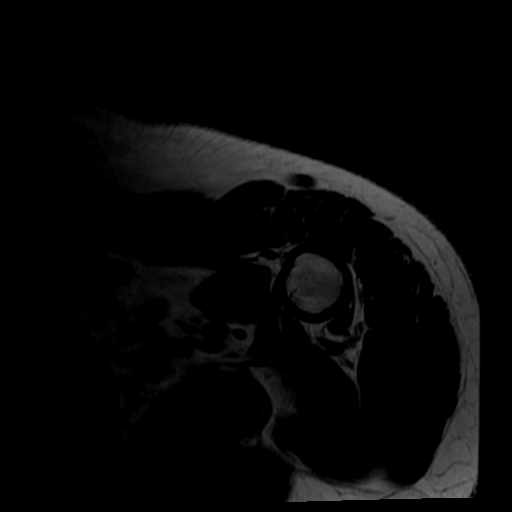
[im 12/23]
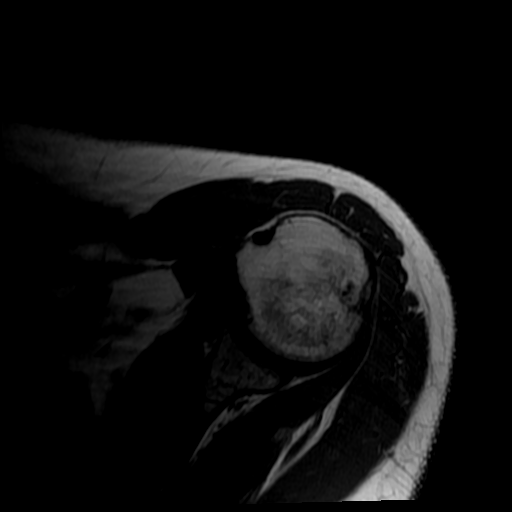
[im 19/23]
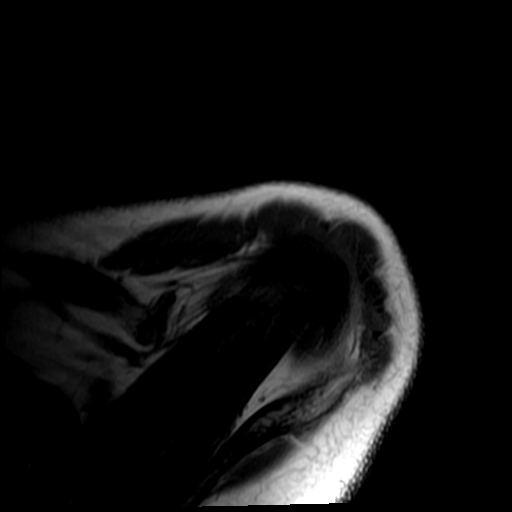

[Series 4: PD fat-sat · axial · 3.5mm · 0.31mm/px · z∈[-51,+34]mm · 7 of 23 slices shown (1 of 2)]
[im 1/23]
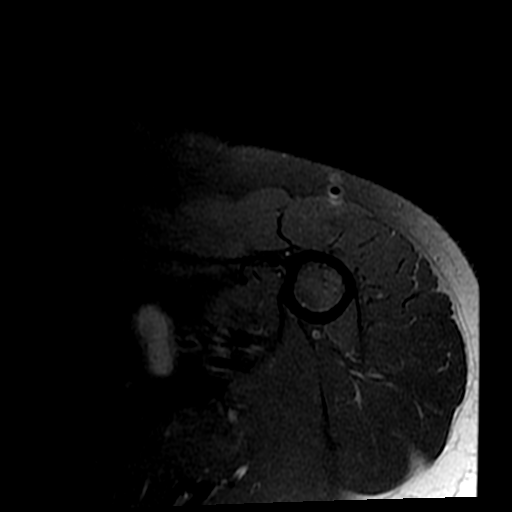
[im 4/23]
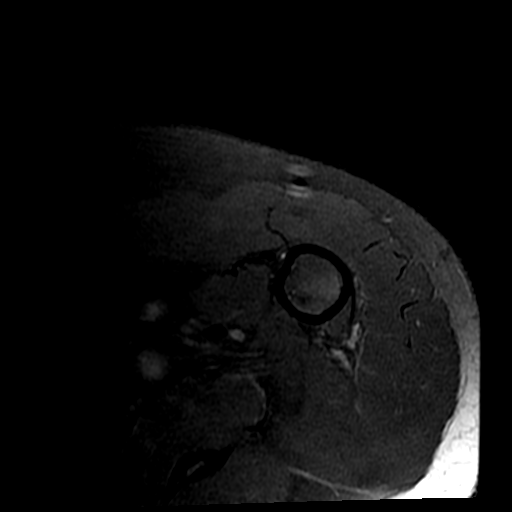
[im 8/23]
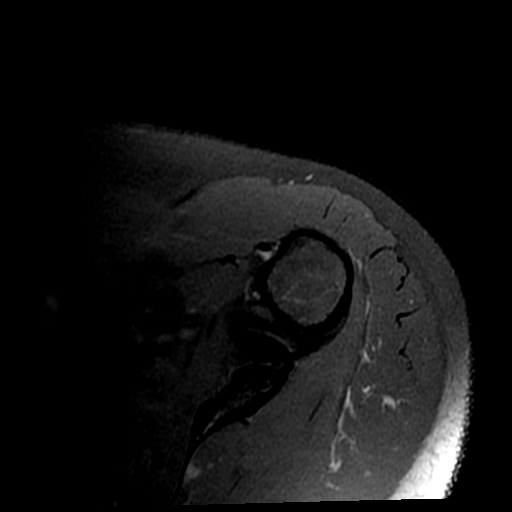
[im 12/23]
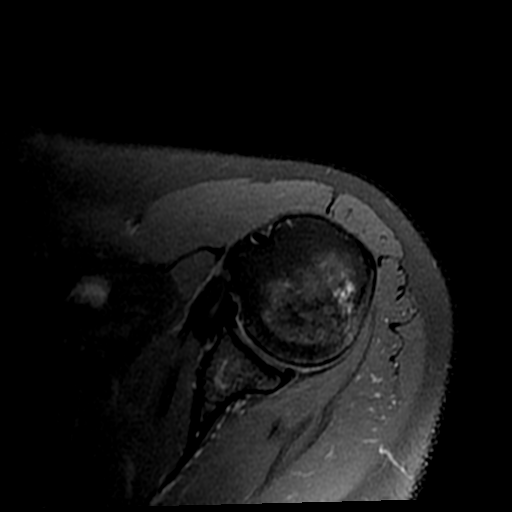
[im 15/23]
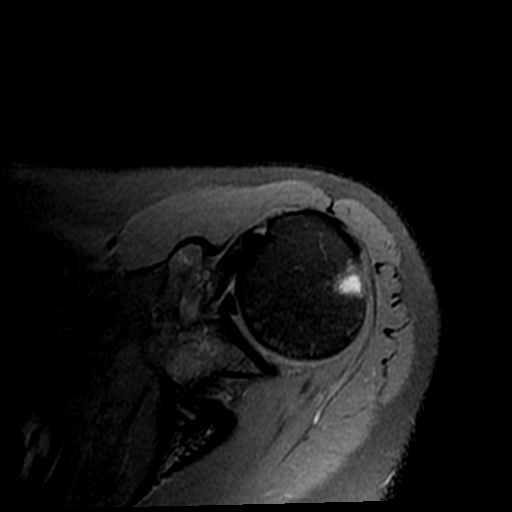
[im 19/23]
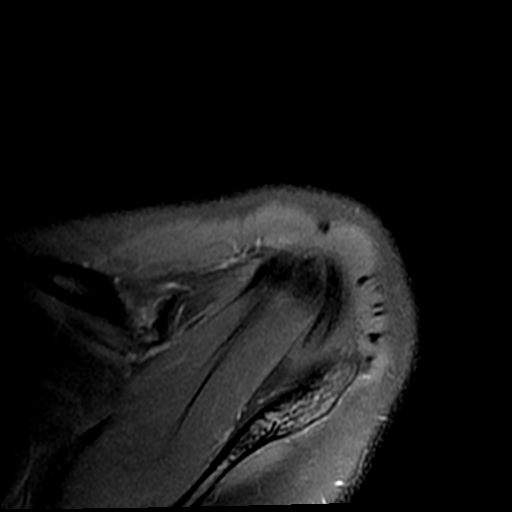
[im 23/23]
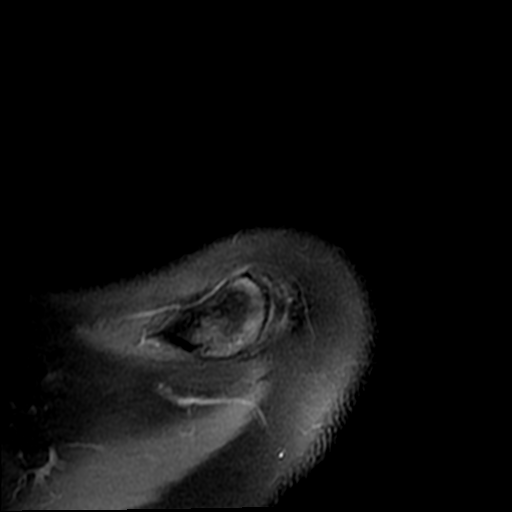

[Series 6: T2 fat-sat · oblique · 3.5mm · 0.62mm/px · 6 of 20 slices shown]
[im 1/20]
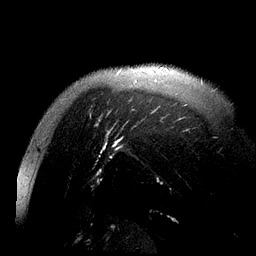
[im 4/20]
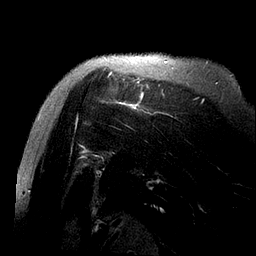
[im 8/20]
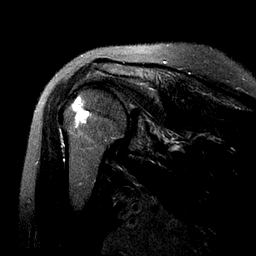
[im 12/20]
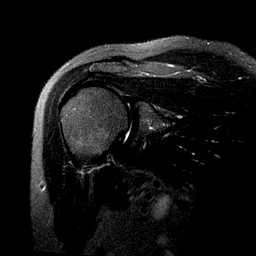
[im 16/20]
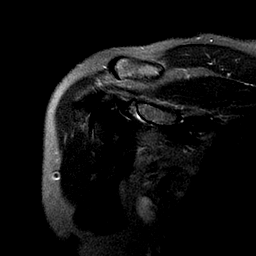
[im 20/20]
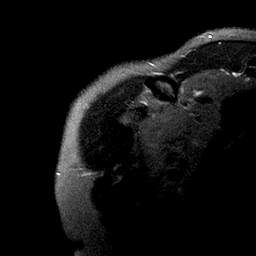

[Series 8: PD fat-sat · oblique · 3.5mm · 0.31mm/px · 7 of 24 slices shown (2 of 2)]
[im 1/24]
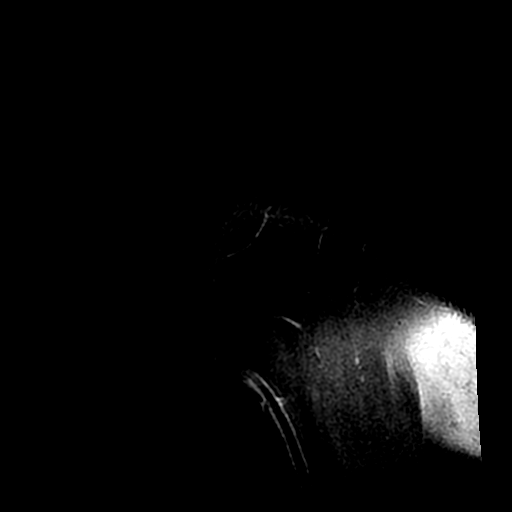
[im 4/24]
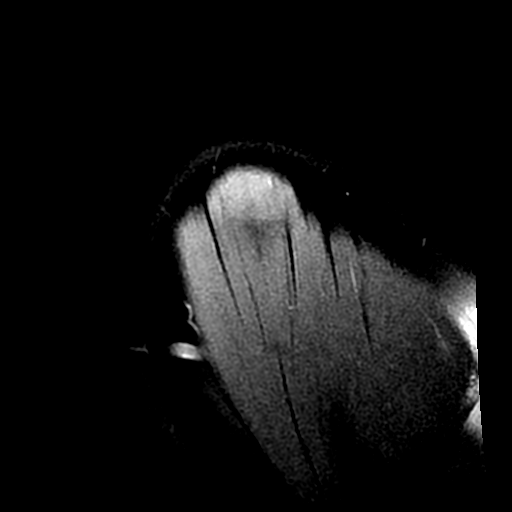
[im 8/24]
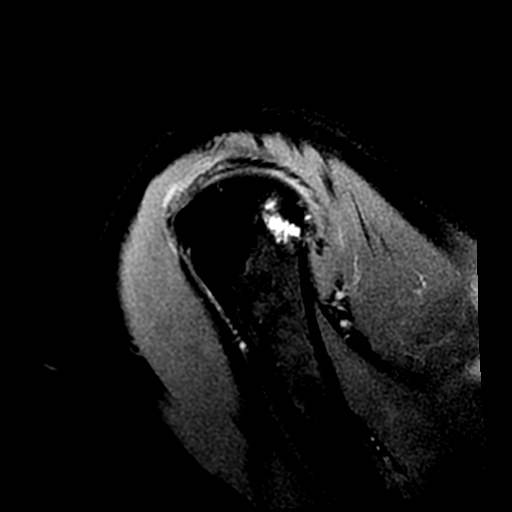
[im 12/24]
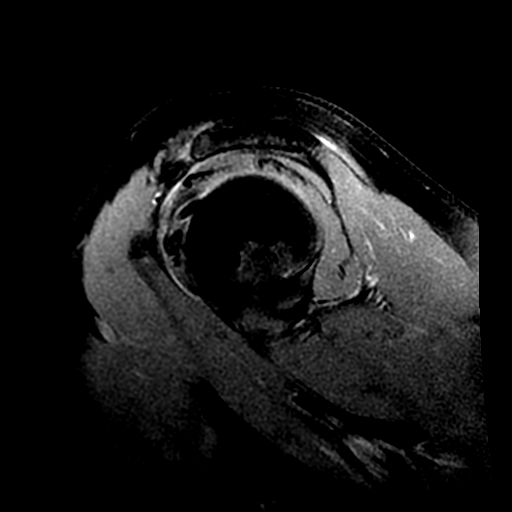
[im 16/24]
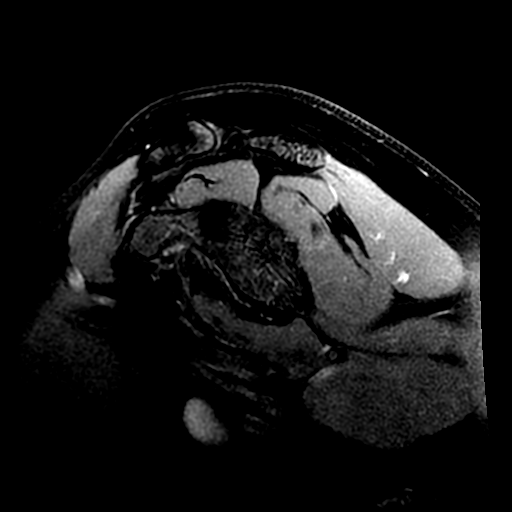
[im 20/24]
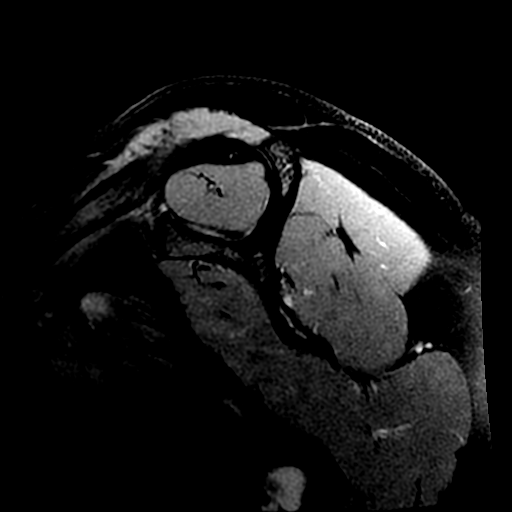
[im 24/24]
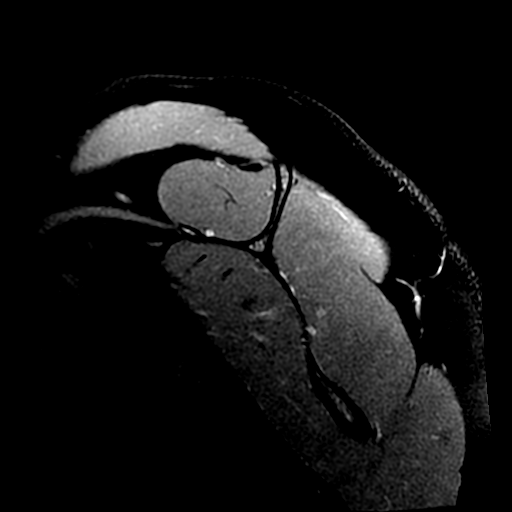

[23 of 40 positions shown; findings below may reference images not displayed]

FINDINGS: Rotator cuff: The supraspinatus, infraspinatus, teres minor, and subscapularis tendons are intact.
No evidence of rotator cuff muscle atrophy.

Superior labrum and biceps: The superior labrum is intact.  The long head of biceps tendon is
intact at its superior anchor on the glenoid.

Anterior, inferior, and posterior glenoid labroligamentous complexes: Increased fluid signal within
the anterior labrum.  The posterior labrum and inferior labrum are intact.

Glenohumeral cartilage and bone marrow: The glenohumeral cartilage is preserved.  Increased fluid
signal within the posterior humeral head with associated Hill-Sachs impaction fracture.

Acromioclavicular joint and coracoclavicular ligaments: The acromioclavicular joint is intact.  No
evidence of arthritis.  The coracoclavicular ligaments are intact.

Other: No joint effusion.  The axillary neurovascular bundle is unremarkable.  No axillary
lymphadenopathy.
IMPRESSION: 1.  Hill-Sachs impaction fracture.  Correlate for recent history of shoulder dislocation.

2.  Increase fluid signal within the anterior labrum suggestive of a labral tear likely associated
with recent dislocation relocation.

3.  Intact rotator cuff.

## 2021-03-04 IMAGING — CR XR shoulder complete LT
3 series · 3 of 3 positions shown · non-contrast
Comparison: None

Procedure(s): XR shoulder complete LT
PROCEDURE:

XR SHOULDER 3-V Left
HISTORY: Left shoulder pain
TECHNIQUE: Three views of the left  shoulder .

[AP (1 of 2)]
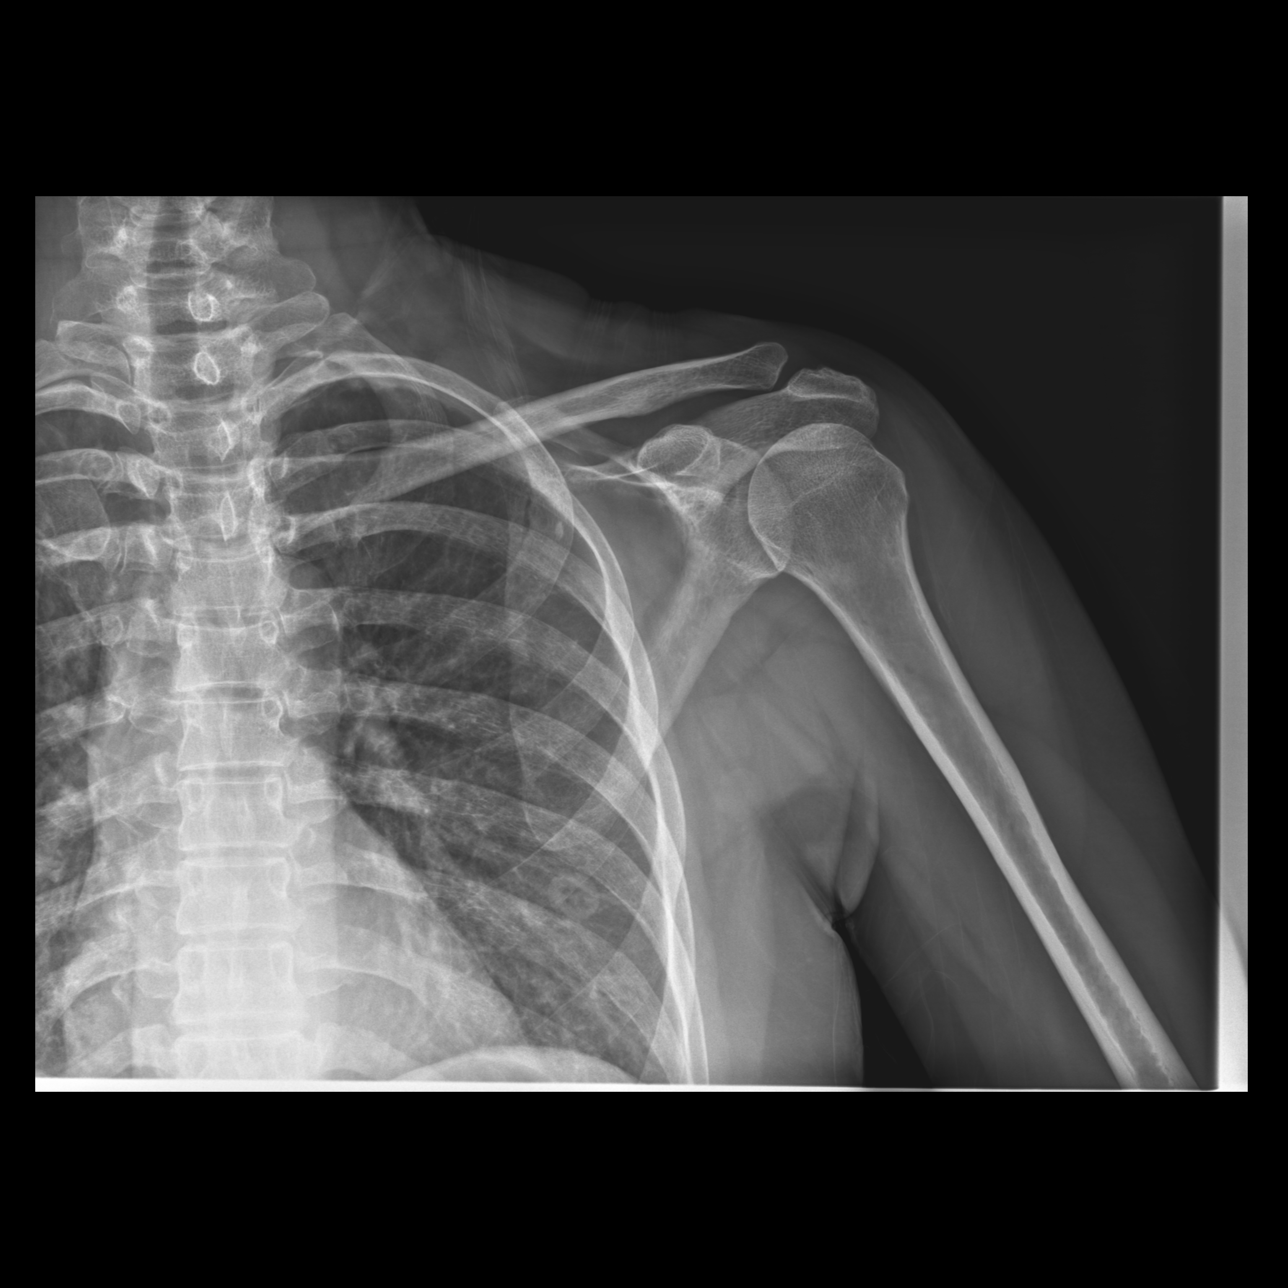

[AP (2 of 2)]
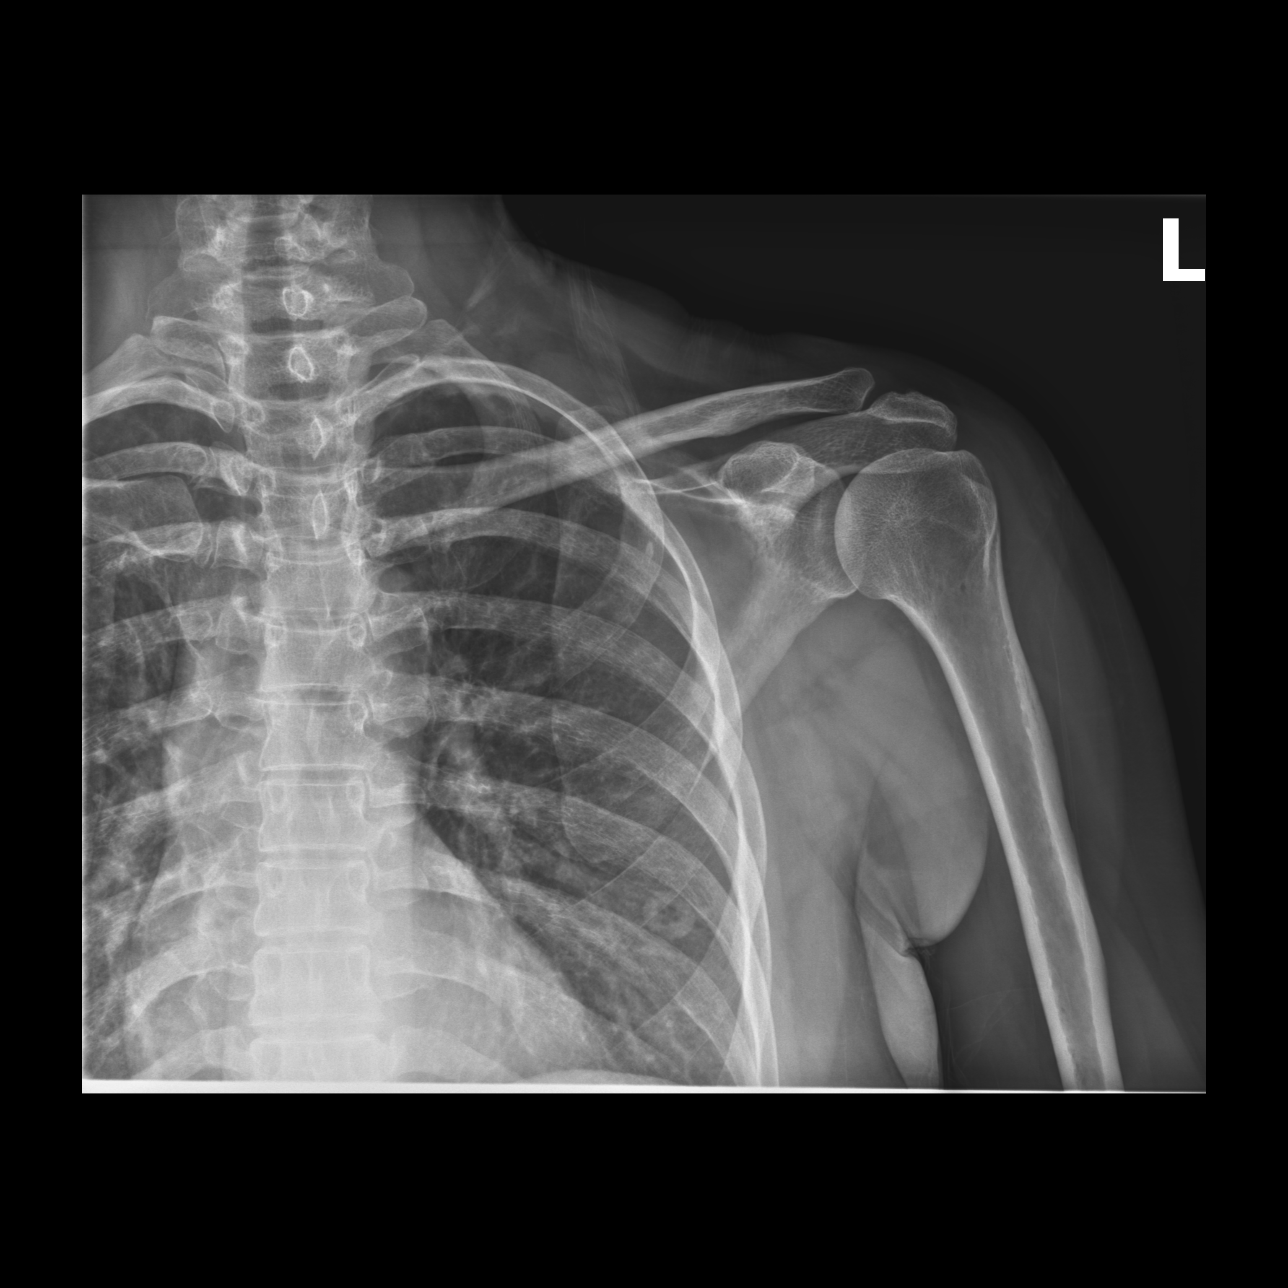

[lateral]
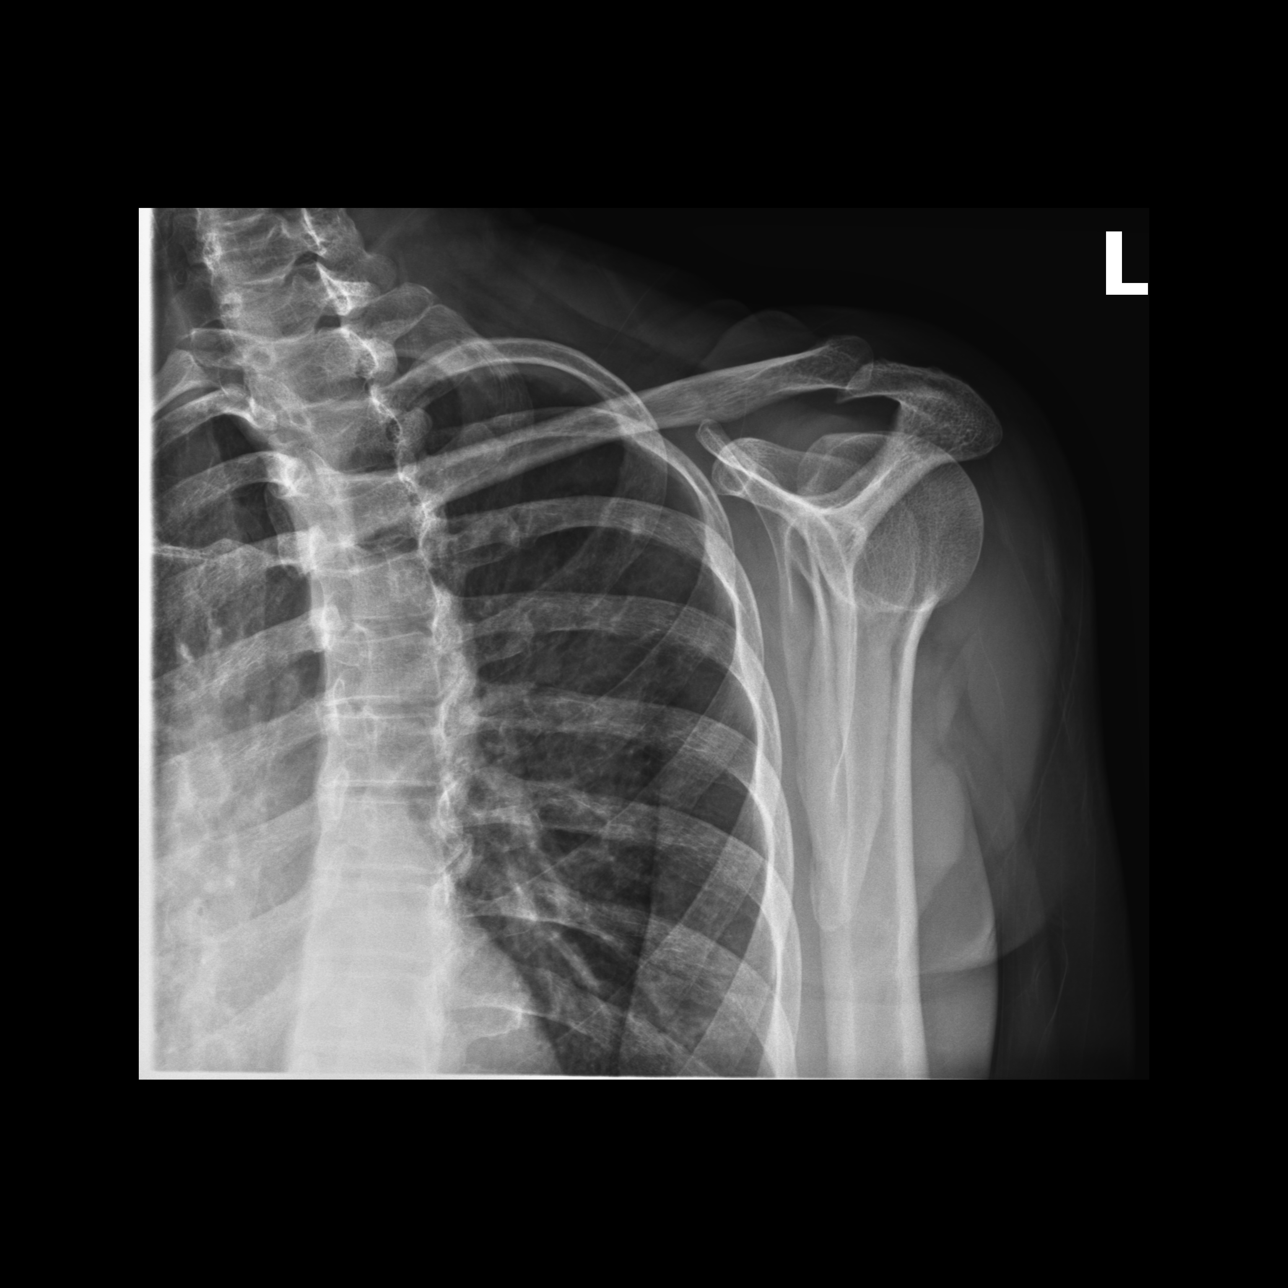

[3 of 3 positions shown; findings below may reference images not displayed]

FINDINGS: Bones: No fracture or dislocation is present.

Joint: The joint spaces are normal.

Soft Tissue: Normal.

The visualized left   lung apex is clear.
IMPRESSION: No acute traumatic osseous injury.
